# Patient Record
Sex: Male | Born: 1950
Health system: Southern US, Community
[De-identification: ages and names within clinical notes are randomized; demographics above are authoritative.]

## PROBLEM LIST (undated history)

## (undated) DIAGNOSIS — I209 Angina pectoris, unspecified: Secondary | ICD-10-CM

## (undated) DIAGNOSIS — I10 Essential (primary) hypertension: Secondary | ICD-10-CM

## (undated) HISTORY — DX: Angina pectoris, unspecified: I20.9

---

## 2002-03-19 ENCOUNTER — Encounter: Payer: Self-pay | Admitting: Emergency Medicine

## 2002-03-19 ENCOUNTER — Emergency Department (HOSPITAL_COMMUNITY): Admission: EM | Admit: 2002-03-19 | Discharge: 2002-03-19 | Payer: Self-pay | Admitting: Emergency Medicine

## 2008-09-07 ENCOUNTER — Encounter: Payer: Self-pay | Admitting: Cardiovascular Disease

## 2008-09-07 ENCOUNTER — Ambulatory Visit: Payer: Self-pay | Admitting: Cardiovascular Disease

## 2008-09-14 DIAGNOSIS — I10 Essential (primary) hypertension: Secondary | ICD-10-CM

## 2008-09-14 HISTORY — DX: Essential (primary) hypertension: I10

## 2011-12-04 ENCOUNTER — Emergency Department (HOSPITAL_COMMUNITY)
Admission: EM | Admit: 2011-12-04 | Discharge: 2011-12-04 | Disposition: A | Discharge status: Home / Self Care | Payer: Non-veteran care | Attending: Emergency Medicine | Admitting: Emergency Medicine

## 2011-12-04 ENCOUNTER — Encounter (HOSPITAL_COMMUNITY): Payer: Self-pay | Admitting: Emergency Medicine

## 2011-12-04 DIAGNOSIS — J029 Acute pharyngitis, unspecified: Secondary | ICD-10-CM

## 2011-12-04 DIAGNOSIS — I10 Essential (primary) hypertension: Secondary | ICD-10-CM | POA: Insufficient documentation

## 2011-12-04 DIAGNOSIS — F17200 Nicotine dependence, unspecified, uncomplicated: Secondary | ICD-10-CM | POA: Insufficient documentation

## 2011-12-04 HISTORY — DX: Essential (primary) hypertension: I10

## 2011-12-04 MED ORDER — HYDROCODONE-ACETAMINOPHEN 5-325 MG PO TABS: 1.0000 | ORAL_TABLET | Freq: Four times a day (QID) | ORAL | Status: AC | PRN

## 2011-12-04 MED ORDER — PENICILLIN V POTASSIUM 500 MG PO TABS: 500.0000 mg | ORAL_TABLET | Freq: Four times a day (QID) | ORAL | Status: AC

## 2011-12-04 NOTE — Discharge Instructions (Signed)
Follow up with your md or dr. Jenne Pane if not improving.

## 2011-12-04 NOTE — Progress Notes (Signed)
Stephen Walker, 61 year old African American male is in Trauma C, and his family is in consultation room C 26.  I received a referral to this family from Chaplain Self who ministered to them this afternoon.  More family members arrived this evening, and I joined with their loved ones.  Patient will be moving to room 2102 tonight.  I will follow up as needed.

## 2011-12-04 NOTE — ED Notes (Signed)
For 1.5 weeks, had swollen tonsils, difficulty swallowing, sore throat, radiating to ear; denies fevers;

## 2011-12-04 NOTE — ED Provider Notes (Signed)
History   This chart was scribed for Stephen Lennert, MD by Sofie Rower. The patient was seen in room TR10C/TR10C and the patient's care was started at 7:06 PM     CSN: [id number]  Arrival date & time 12/04/11  1719   None     Chief Complaint  Patient presents with  . Sore Throat    (Consider location/radiation/quality/duration/timing/severity/associated sxs/prior treatment) Patient is a 61 y.o. male presenting with pharyngitis. The history is provided by the patient. No language interpreter was used.  Sore Throat The current episode started more than 1 week ago. The problem occurs constantly. The problem has not changed since onset.Pertinent negatives include no chest pain, no abdominal pain, no headaches and no shortness of breath. Nothing aggravates the symptoms. Nothing relieves the symptoms. He has tried nothing for the symptoms. The treatment provided no relief.   Stephen Walker is a 61 y.o. male who presents to the Emergency Department complaining of moderate, episodic sore throat onset seven days ago with associated symptoms of difficulty swallowing, pain radiating to the ears.   PCP is the [facility].   Past Medical History  Diagnosis Date  . Hypertension       History  Substance Use Topics  . Smoking status: Current Everyday Smoker -- 0.5 packs/day  . Smokeless tobacco: Not on file  . Alcohol Use: No      Review of Systems  Constitutional: Negative for fatigue.  HENT: Negative for congestion, sinus pressure and ear discharge.   Eyes: Negative for discharge.  Respiratory: Negative for cough and shortness of breath.   Cardiovascular: Negative for chest pain.  Gastrointestinal: Negative for abdominal pain and diarrhea.  Genitourinary: Negative for frequency and hematuria.  Musculoskeletal: Negative for back pain.  Skin: Negative for rash.  Neurological: Negative for seizures and headaches.  Hematological: Negative.   Psychiatric/Behavioral: Negative for  hallucinations.    Allergies  Review of patient's allergies indicates no known allergies.  Home Medications   Current Outpatient Rx  Name Route Sig Dispense Refill  . LISINOPRIL-HYDROCHLOROTHIAZIDE 10-12.5 MG PO TABS Oral Take 1 tablet by mouth daily.    Marland Kitchen METOPROLOL SUCCINATE ER 100 MG PO TB24 Oral Take 50 mg by mouth daily. Take with or immediately following a meal.      BP 126/92  Pulse 70  Temp 98.5 F (36.9 C) (Oral)  Resp 19  SpO2 96%  Physical Exam  Nursing note and vitals reviewed. Constitutional: He is oriented to person, place, and time. He appears well-developed.  HENT:  Head: Normocephalic.  Mouth/Throat: Posterior oropharyngeal erythema (Minimal) present.  Eyes: Conjunctivae are normal.  Neck: No tracheal deviation present.       Bilateral anterior cervical swollen lymph nodes.   Cardiovascular: Normal rate and normal heart sounds.   No murmur heard. Pulmonary/Chest: Effort normal.  Musculoskeletal: Normal range of motion.  Neurological: He is oriented to person, place, and time.  Skin: Skin is warm.  Psychiatric: He has a normal mood and affect.    ED Course  Procedures (including critical care time)  DIAGNOSTIC STUDIES: Oxygen Saturation is 96% on room air, adequate by my interpretation.    COORDINATION OF CARE:  7:11PM- EDP at bedside discusses treatment plan.     Labs Reviewed - No data to display No results found.   No diagnosis found.    MDM       The chart was scribed for me under my direct supervision.  I personally performed the history, physical,  and medical decision making and all procedures in the evaluation of this patient.Stephen Lennert, MD 12/04/11 [phone]

## 2011-12-07 DIAGNOSIS — G931 Anoxic brain damage, not elsewhere classified: Secondary | ICD-10-CM

## 2011-12-07 DIAGNOSIS — G40109 Localization-related (focal) (partial) symptomatic epilepsy and epileptic syndromes with simple partial seizures, not intractable, without status epilepticus: Secondary | ICD-10-CM

## 2012-03-19 ENCOUNTER — Encounter: Payer: Self-pay | Admitting: Internal Medicine

## 2012-03-19 ENCOUNTER — Ambulatory Visit (INDEPENDENT_AMBULATORY_CARE_PROVIDER_SITE_OTHER): Payer: Non-veteran care | Admitting: Internal Medicine

## 2012-03-19 VITALS — BP 134/85 | HR 78 | Temp 97.9°F | Ht 73.0 in | Wt 186.4 lb

## 2012-03-19 DIAGNOSIS — Z636 Dependent relative needing care at home: Secondary | ICD-10-CM | POA: Insufficient documentation

## 2012-03-19 DIAGNOSIS — F4323 Adjustment disorder with mixed anxiety and depressed mood: Secondary | ICD-10-CM

## 2012-03-19 DIAGNOSIS — I10 Essential (primary) hypertension: Secondary | ICD-10-CM

## 2012-03-19 DIAGNOSIS — Z6379 Other stressful life events affecting family and household: Secondary | ICD-10-CM

## 2012-03-19 HISTORY — DX: Adjustment disorder with mixed anxiety and depressed mood: F43.23

## 2012-03-19 HISTORY — DX: Dependent relative needing care at home: Z63.6

## 2012-03-19 MED ORDER — ALPRAZOLAM 0.25 MG PO TABS: 0.2500 mg | ORAL_TABLET | Freq: Three times a day (TID) | ORAL | Status: AC | PRN

## 2012-03-19 NOTE — Progress Notes (Signed)
  Subjective:    Patient ID: Stephen Walker, male    DOB: 01-08-1951, 61 y.o.   MRN: [id number]  HPI  Pt presents acutely as new pt to clinic for stress and anxiety secondary to his mother's illness who is currently being treated on inpatient service.  He states that he "feels on edge", is only able to sleep 2 hours a night due to thoughts of his mother's impending death.  He endorses thoughts of suicide but denies a plan or homicidal ideation. He is a [place] vet who states that he did have PTSD treated with Valium in the 1970's but has no current issues.  He gets his primary care through [facility] in [place] and last saw PCP 3 months ago. 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  Review of Systems  Constitutional: Negative for fatigue.  HENT: Negative.   Eyes: Negative.   Respiratory: Negative.   Cardiovascular: Negative.   Musculoskeletal: Negative.   Neurological: Negative for dizziness, light-headedness and headaches.  Psychiatric/Behavioral: Positive for suicidal ideas, disturbed wake/sleep cycle, dysphoric mood, decreased concentration and agitation. Negative for confusion and self-injury. The patient is nervous/anxious.        Objective:   Physical Exam  Constitutional: He is oriented to person, place, and time. He appears well-developed and well-nourished. No distress.       Pleasant, conversant, AA male  HENT:  Head: Normocephalic and atraumatic.  Eyes: Conjunctivae normal and EOM are normal. Pupils are equal, round, and reactive to light.  Neurological: He is alert and oriented to person, place, and time.  Psychiatric: His speech is normal and behavior is normal. Judgment and thought content normal. His mood appears anxious. His affect is blunt. Cognition and memory are normal.          Assessment & Plan:  1. Situational Anxiety and Depression, acute :  Secondary to Caregiver Stress -xanax 0.25 mg q8h prn anxiety #30 no  refills given; consider increase to 0.50 mg if needed -counseling regarding caregiver stress, need to contact clinic or go to ED if anxiety worsens of fills suicidal or homicidal -declines chaplain

## 2012-03-19 NOTE — Assessment & Plan Note (Signed)
Secondary to Caregiver stress and impending death of mother. Mother currently inpatient on Hospice. No family support.

## 2012-03-19 NOTE — Patient Instructions (Signed)
If your mood worsens please call the clinic or go to the ED to be further evaluated. [facility].

## 2012-03-19 NOTE — Assessment & Plan Note (Signed)
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  Brother died years ago. No wife thus no family support. Declined chaplain comfort.

## 2018-05-11 ENCOUNTER — Ambulatory Visit (INDEPENDENT_AMBULATORY_CARE_PROVIDER_SITE_OTHER): Payer: Medicare Other | Admitting: Emergency Medicine

## 2018-05-11 ENCOUNTER — Encounter: Payer: Self-pay | Admitting: Emergency Medicine

## 2018-05-11 ENCOUNTER — Other Ambulatory Visit: Payer: Self-pay

## 2018-05-11 VITALS — BP 146/82 | HR 54 | Temp 99.2°F | Resp 16 | Ht 72.0 in | Wt 178.2 lb

## 2018-05-11 DIAGNOSIS — I10 Essential (primary) hypertension: Secondary | ICD-10-CM | POA: Diagnosis not present

## 2018-05-11 DIAGNOSIS — M25561 Pain in right knee: Secondary | ICD-10-CM

## 2018-05-11 DIAGNOSIS — G8929 Other chronic pain: Secondary | ICD-10-CM

## 2018-05-11 DIAGNOSIS — G4700 Insomnia, unspecified: Secondary | ICD-10-CM | POA: Insufficient documentation

## 2018-05-11 HISTORY — DX: Insomnia, unspecified: G47.00

## 2018-05-11 HISTORY — DX: Other chronic pain: G89.29

## 2018-05-11 MED ORDER — TRAZODONE HCL 50 MG PO TABS: 50.0000 mg | ORAL_TABLET | Freq: Every day | ORAL | 3 refills | Status: DC

## 2018-05-11 MED ORDER — HYDROCHLOROTHIAZIDE 25 MG PO TABS: 25.0000 mg | ORAL_TABLET | Freq: Every day | ORAL | 3 refills | Status: DC

## 2018-05-11 MED ORDER — METOPROLOL SUCCINATE 200 MG PO CS24: 1.0000 | EXTENDED_RELEASE_CAPSULE | Freq: Every day | ORAL | 3 refills | Status: DC

## 2018-05-11 NOTE — Progress Notes (Signed)
Stephen Walker 67 y.o.   Chief Complaint  Patient presents with  . New pt  . Blood Pressure  . Arthritis    right knee x 2 mos  . Medication Refill    Trazadine HCL 50 mg, HCTZ 25 mg, Metoprolol Succinate 200 mg    HISTORY OF PRESENT ILLNESS: This is a 67 y.o. male new patient with a history of hypertension needs medication refills.  Also complaining of chronic pain to his right knee.  No other significant symptoms.  No other complaints.  HPI   Prior to Admission medications   Medication Sig Start Date End Date Taking? Authorizing Provider  hydrochlorothiazide (HYDRODIURIL) 25 MG tablet Take 25 mg by mouth daily.   Yes [provider]  Metoprolol Succinate 200 MG CS24 Take by mouth.   Yes [provider]  traZODone (DESYREL) 50 MG tablet Take 50 mg by mouth at bedtime.   Yes [provider]    No Known Allergies  There are no active problems to display for this patient.   Past Medical History:  Diagnosis Date  . Hypertension       Social History   Socioeconomic History  . Marital status: Married    Spouse name: Not on file  . Number of children: Not on file  . Years of education: Not on file  . Highest education level: Not on file  Occupational History  . Not on file  Social Needs  . Financial resource strain: Not on file  . Food insecurity:    Worry: Not on file    Inability: Not on file  . Transportation needs:    Medical: Not on file    Non-medical: Not on file  Tobacco Use  . Smoking status: Current Every Day Smoker  . Smokeless tobacco: Never Used  Substance and Sexual Activity  . Alcohol use: Never    Frequency: Never  . Drug use: Never  . Sexual activity: Not on file  Lifestyle  . Physical activity:    Days per week: Not on file    Minutes per session: Not on file  . Stress: Not on file  Relationships  . Social connections:    Talks on phone: Not on file    Gets together: Not on file    Attends religious  service: Not on file    Active member of club or organization: Not on file    Attends meetings of clubs or organizations: Not on file    Relationship status: Not on file  . Intimate partner violence:    Fear of current or ex partner: Not on file    Emotionally abused: Not on file    Physically abused: Not on file    Forced sexual activity: Not on file  Other Topics Concern  . Not on file  Social History Narrative  . Not on file    Family History  Problem Relation Age of Onset  . Heart disease Mother        Atrial Fibrillation     Review of Systems  Constitutional: Negative.  Negative for chills and fever.  HENT: Negative.  Negative for congestion, hearing loss, nosebleeds and sore throat.   Eyes: Negative.  Negative for blurred vision and double vision.  Respiratory: Negative.  Negative for cough and shortness of breath.   Cardiovascular: Negative.  Negative for chest pain and palpitations.  Gastrointestinal: Negative.  Negative for abdominal pain, diarrhea, nausea and vomiting.  Genitourinary: Negative.   Musculoskeletal:  Negative.  Negative for back pain, myalgias and neck pain.  Skin: Negative.  Negative for rash.  Neurological: Negative.  Negative for dizziness and headaches.  Endo/Heme/Allergies: Negative.   All other systems reviewed and are negative.   Vitals:   05/11/18 1059 05/11/18 1109  BP: (!) 178/99 (!) 146/82  Pulse: (!) 54   Resp: 16   Temp: 99.2 F (37.3 C)   SpO2: 96%     Physical Exam  Constitutional: He is oriented to person, place, and time. He appears well-developed and well-nourished.  HENT:  Head: Normocephalic and atraumatic.  Nose: Nose normal.  Mouth/Throat: Oropharynx is clear and moist.  Eyes: Pupils are equal, round, and reactive to light. Conjunctivae and EOM are normal.  Neck: Normal range of motion. Neck supple.  Cardiovascular: Normal rate, regular rhythm and normal heart sounds.  Pulmonary/Chest: Effort normal and breath sounds  normal.  Musculoskeletal:  Right knee: No erythema or bruising.  Positive swelling.  Nontender.  Full range of motion. Left knee: Normal limits.  Neurological: He is alert and oriented to person, place, and time. No sensory deficit. He exhibits normal muscle tone.  Skin: Skin is warm and dry.  Psychiatric: He has a normal mood and affect. His behavior is normal.  Vitals reviewed.    ASSESSMENT & PLAN: Stephen Walker was seen today for new pt, blood pressure, arthritis and medication refill.  Diagnoses and all orders for this visit:  Uncontrolled hypertension  Essential hypertension -     hydrochlorothiazide (HYDRODIURIL) 25 MG tablet; Take 1 tablet (25 mg total) by mouth daily. -     Metoprolol Succinate 200 MG CS24; Take 1 tablet by mouth daily.  Insomnia, unspecified type -     traZODone (DESYREL) 50 MG tablet; Take 1 tablet (50 mg total) by mouth at bedtime.  Chronic pain of right knee    Patient Instructions       If you have lab work done today you will be contacted with your lab results within the next 2 weeks.  If you have not heard from [place] then please contact us. The fastest way to get your results is to register for My Chart.   IF you received an x-ray today, you will receive an invoice from [facility]. Please contact [facility] at [phone] with questions or concerns regarding your invoice.   IF you received labwork today, you will receive an invoice from [facility]. Please contact [facility] at [phone] with questions or concerns regarding your invoice.   Our billing staff will not be able to assist you with questions regarding bills from these companies.  You will be contacted with the lab results as soon as they are available. The fastest way to get your results is to activate your My Chart account. Instructions are located on the last page of this paperwork. If you have not heard from [place] regarding the results in 2 weeks, please contact this  office.     Hypertension Hypertension, commonly called high blood pressure, is when the force of blood pumping through the arteries is too strong. The arteries are the blood vessels that carry blood from the heart throughout the body. Hypertension forces the heart to work harder to pump blood and may cause arteries to become narrow or stiff. Having untreated or uncontrolled hypertension can cause heart attacks, strokes, kidney disease, and other problems. A blood pressure reading consists of a higher number over a lower number. Ideally, your blood pressure should be below 120/80. The first ("  top") number is called the systolic pressure. It is a measure of the pressure in your arteries as your heart beats. The second ("bottom") number is called the diastolic pressure. It is a measure of the pressure in your arteries as the heart relaxes. What are the causes? The cause of this condition is not known. What increases the risk? Some risk factors for high blood pressure are under your control. Others are not. Factors you can change  Smoking.  Having type 2 diabetes mellitus, high cholesterol, or both.  Not getting enough exercise or physical activity.  Being overweight.  Having too much fat, sugar, calories, or salt (sodium) in your diet.  Drinking too much alcohol. Factors that are difficult or impossible to change  Having chronic kidney disease.  Having a family history of high blood pressure.  Age. Risk increases with age.  Race. You may be at higher risk if you are African-American.  Gender. Men are at higher risk than women before age 65. After age 65, women are at higher risk than men.  Having obstructive sleep apnea.  Stress. What are the signs or symptoms? Extremely high blood pressure (hypertensive crisis) may cause:  Headache.  Anxiety.  Shortness of breath.  Nosebleed.  Nausea and vomiting.  Severe chest pain.  Jerky movements you cannot control  (seizures).  How is this diagnosed? This condition is diagnosed by measuring your blood pressure while you are seated, with your arm resting on a surface. The Walker of the blood pressure monitor will be placed directly against the skin of your upper arm at the level of your heart. It should be measured at least twice using the same arm. Certain conditions can cause a difference in blood pressure between your right and left arms. Certain factors can cause blood pressure readings to be lower or higher than normal (elevated) for a short period of time:  When your blood pressure is higher when you are in a health care provider's office than when you are at home, this is called white coat hypertension. Most people with this condition do not need medicines.  When your blood pressure is higher at home than when you are in a health care provider's office, this is called masked hypertension. Most people with this condition may need medicines to control blood pressure.  If you have a high blood pressure reading during one visit or you have normal blood pressure with other risk factors:  You may be asked to return on a different day to have your blood pressure checked again.  You may be asked to monitor your blood pressure at home for 1 week or longer.  If you are diagnosed with hypertension, you may have other blood or imaging tests to help your health care provider understand your overall risk for other conditions. How is this treated? This condition is treated by making healthy lifestyle changes, such as eating healthy foods, exercising more, and reducing your alcohol intake. Your health care provider may prescribe medicine if lifestyle changes are not enough to get your blood pressure under control, and if:  Your systolic blood pressure is above 130.  Your diastolic blood pressure is above 80.  Your personal target blood pressure may vary depending on your medical conditions, your age, and other  factors. Follow these instructions at home: Eating and drinking  Eat a diet that is high in fiber and potassium, and low in sodium, added sugar, and fat. An example eating plan is called the DASH (Dietary  Approaches to Stop Hypertension) diet. To eat this way: ? Eat plenty of fresh fruits and vegetables. Try to fill half of your plate at each meal with fruits and vegetables. ? Eat whole grains, such as whole wheat pasta, brown rice, or whole grain bread. Fill about one quarter of your plate with whole grains. ? Eat or drink low-fat dairy products, such as skim milk or low-fat yogurt. ? Avoid fatty cuts of meat, processed or cured meats, and poultry with skin. Fill about one quarter of your plate with lean proteins, such as fish, chicken without skin, beans, eggs, and tofu. ? Avoid premade and processed foods. These tend to be higher in sodium, added sugar, and fat.  Reduce your daily sodium intake. Most people with hypertension should eat less than 1,500 mg of sodium a day.  Limit alcohol intake to no more than 1 drink a day for nonpregnant women and 2 drinks a day for men. One drink equals 12 oz of beer, 5 oz of wine, or 1 oz of hard liquor. Lifestyle  Work with your health care provider to maintain a healthy body weight or to lose weight. Ask what an ideal weight is for you.  Get at least 30 minutes of exercise that causes your heart to beat faster (aerobic exercise) most days of the week. Activities may include walking, swimming, or biking.  Include exercise to strengthen your muscles (resistance exercise), such as pilates or lifting weights, as part of your weekly exercise routine. Try to do these types of exercises for 30 minutes at least 3 days a week.  Do not use any products that contain nicotine or tobacco, such as cigarettes and e-cigarettes. If you need help quitting, ask your health care provider.  Monitor your blood pressure at home as told by your health care provider.  Keep  all follow-up visits as told by your health care provider. This is important. Medicines  Take over-the-counter and prescription medicines only as told by your health care provider. Follow directions carefully. Blood pressure medicines must be taken as prescribed.  Do not skip doses of blood pressure medicine. Doing this puts you at risk for problems and can make the medicine less effective.  Ask your health care provider about side effects or reactions to medicines that you should watch for. Contact a health care provider if:  You think you are having a reaction to a medicine you are taking.  You have headaches that keep coming back (recurring).  You feel dizzy.  You have swelling in your ankles.  You have trouble with your vision. Get help right away if:  You develop a severe headache or confusion.  You have unusual weakness or numbness.  You feel faint.  You have severe pain in your chest or abdomen.  You vomit repeatedly.  You have trouble breathing. Summary  Hypertension is when the force of blood pumping through your arteries is too strong. If this condition is not controlled, it may put you at risk for serious complications.  Your personal target blood pressure may vary depending on your medical conditions, your age, and other factors. For most people, a normal blood pressure is less than 120/80.  Hypertension is treated with lifestyle changes, medicines, or a combination of both. Lifestyle changes include weight loss, eating a healthy, low-sodium diet, exercising more, and limiting alcohol. This information is not intended to replace advice given to you by your health care provider. Make sure you discuss any questions you have with  your health care provider. Document Released: 05/26/2005 Document Revised: 04/23/2016 Document Reviewed: 04/23/2016 Elsevier Interactive Patient Education  2018 [employer]      Edwina Barth, MD [facility]

## 2018-05-11 NOTE — Patient Instructions (Addendum)
   If you have lab work done today you will be contacted with your lab results within the next 2 weeks.  If you have not heard from us then please contact us. The fastest way to get your results is to register for My Chart.   IF you received an x-ray today, you will receive an invoice from [facility]. Please contact [facility] at [phone] with questions or concerns regarding your invoice.   IF you received labwork today, you will receive an invoice from [facility]. Please contact [facility] at [phone] with questions or concerns regarding your invoice.   Our billing staff will not be able to assist you with questions regarding bills from these companies.  You will be contacted with the lab results as soon as they are available. The fastest way to get your results is to activate your My Chart account. Instructions are located on the last page of this paperwork. If you have not heard from us regarding the results in 2 weeks, please contact this office.     Hypertension Hypertension, commonly called high blood pressure, is when the force of blood pumping through the arteries is too strong. The arteries are the blood vessels that carry blood from the heart throughout the body. Hypertension forces the heart to work harder to pump blood and may cause arteries to become narrow or stiff. Having untreated or uncontrolled hypertension can cause heart attacks, strokes, kidney disease, and other problems. A blood pressure reading consists of a higher number over a lower number. Ideally, your blood pressure should be below 120/80. The first ("top") number is called the systolic pressure. It is a measure of the pressure in your arteries as your heart beats. The second ("bottom") number is called the diastolic pressure. It is a measure of the pressure in your arteries as the heart relaxes. What are the causes? The cause of this condition is not known. What increases the risk? Some  risk factors for high blood pressure are under your control. Others are not. Factors you can change  Smoking.  Having type 2 diabetes mellitus, high cholesterol, or both.  Not getting enough exercise or physical activity.  Being overweight.  Having too much fat, sugar, calories, or salt (sodium) in your diet.  Drinking too much alcohol. Factors that are difficult or impossible to change  Having chronic kidney disease.  Having a family history of high blood pressure.  Age. Risk increases with age.  Race. You may be at higher risk if you are African-American.  Gender. Men are at higher risk than women before age 45. After age 65, women are at higher risk than men.  Having obstructive sleep apnea.  Stress. What are the signs or symptoms? Extremely high blood pressure (hypertensive crisis) may cause:  Headache.  Anxiety.  Shortness of breath.  Nosebleed.  Nausea and vomiting.  Severe chest pain.  Jerky movements you cannot control (seizures).  How is this diagnosed? This condition is diagnosed by measuring your blood pressure while you are seated, with your arm resting on a surface. The cuff of the blood pressure monitor will be placed directly against the skin of your upper arm at the level of your heart. It should be measured at least twice using the same arm. Certain conditions can cause a difference in blood pressure between your right and left arms. Certain factors can cause blood pressure readings to be lower or higher than normal (elevated) for a short period of time:  When   your blood pressure is higher when you are in a health care provider's office than when you are at home, this is called white coat hypertension. Most people with this condition do not need medicines.  When your blood pressure is higher at home than when you are in a health care provider's office, this is called masked hypertension. Most people with this condition may need medicines to control  blood pressure.  If you have a high blood pressure reading during one visit or you have normal blood pressure with other risk factors:  You may be asked to return on a different day to have your blood pressure checked again.  You may be asked to monitor your blood pressure at home for 1 week or longer.  If you are diagnosed with hypertension, you may have other blood or imaging tests to help your health care provider understand your overall risk for other conditions. How is this treated? This condition is treated by making healthy lifestyle changes, such as eating healthy foods, exercising more, and reducing your alcohol intake. Your health care provider may prescribe medicine if lifestyle changes are not enough to get your blood pressure under control, and if:  Your systolic blood pressure is above 130.  Your diastolic blood pressure is above 80.  Your personal target blood pressure may vary depending on your medical conditions, your age, and other factors. Follow these instructions at home: Eating and drinking  Eat a diet that is high in fiber and potassium, and low in sodium, added sugar, and fat. An example eating plan is called the DASH (Dietary Approaches to Stop Hypertension) diet. To eat this way: ? Eat plenty of fresh fruits and vegetables. Try to fill half of your plate at each meal with fruits and vegetables. ? Eat whole grains, such as whole wheat pasta, brown rice, or whole grain bread. Fill about one quarter of your plate with whole grains. ? Eat or drink low-fat dairy products, such as skim milk or low-fat yogurt. ? Avoid fatty cuts of meat, processed or cured meats, and poultry with skin. Fill about one quarter of your plate with lean proteins, such as fish, chicken without skin, beans, eggs, and tofu. ? Avoid premade and processed foods. These tend to be higher in sodium, added sugar, and fat.  Reduce your daily sodium intake. Most people with hypertension should eat less  than 1,500 mg of sodium a day.  Limit alcohol intake to no more than 1 drink a day for nonpregnant women and 2 drinks a day for men. One drink equals 12 oz of beer, 5 oz of wine, or 1 oz of hard liquor. Lifestyle  Work with your health care provider to maintain a healthy body weight or to lose weight. Ask what an ideal weight is for you.  Get at least 30 minutes of exercise that causes your heart to beat faster (aerobic exercise) most days of the week. Activities may include walking, swimming, or biking.  Include exercise to strengthen your muscles (resistance exercise), such as pilates or lifting weights, as part of your weekly exercise routine. Try to do these types of exercises for 30 minutes at least 3 days a week.  Do not use any products that contain nicotine or tobacco, such as cigarettes and e-cigarettes. If you need help quitting, ask your health care provider.  Monitor your blood pressure at home as told by your health care provider.  Keep all follow-up visits as told by your health care provider.   This is important. Medicines  Take over-the-counter and prescription medicines only as told by your health care provider. Follow directions carefully. Blood pressure medicines must be taken as prescribed.  Do not skip doses of blood pressure medicine. Doing this puts you at risk for problems and can make the medicine less effective.  Ask your health care provider about side effects or reactions to medicines that you should watch for. Contact a health care provider if:  You think you are having a reaction to a medicine you are taking.  You have headaches that keep coming back (recurring).  You feel dizzy.  You have swelling in your ankles.  You have trouble with your vision. Get help right away if:  You develop a severe headache or confusion.  You have unusual weakness or numbness.  You feel faint.  You have severe pain in your chest or abdomen.  You vomit  repeatedly.  You have trouble breathing. Summary  Hypertension is when the force of blood pumping through your arteries is too strong. If this condition is not controlled, it may put you at risk for serious complications.  Your personal target blood pressure may vary depending on your medical conditions, your age, and other factors. For most people, a normal blood pressure is less than 120/80.  Hypertension is treated with lifestyle changes, medicines, or a combination of both. Lifestyle changes include weight loss, eating a healthy, low-sodium diet, exercising more, and limiting alcohol. This information is not intended to replace advice given to you by your health care provider. Make sure you discuss any questions you have with your health care provider. Document Released: 05/26/2005 Document Revised: 04/23/2016 Document Reviewed: 04/23/2016 Elsevier Interactive Patient Education  2018 [employer]  

## 2018-05-12 MED ORDER — METOPROLOL TARTRATE 100 MG PO TABS: 100.0000 mg | ORAL_TABLET | Freq: Two times a day (BID) | ORAL | 3 refills | Status: DC

## 2018-05-12 NOTE — Addendum Note (Signed)
Addended by: Evie LacksSAGARDIA, Lavette Yankovich J on: 05/12/2018 09:39 AM   Modules accepted: Orders

## 2018-05-13 ENCOUNTER — Encounter: Payer: Self-pay | Admitting: Internal Medicine

## 2018-06-18 ENCOUNTER — Other Ambulatory Visit: Payer: Self-pay | Admitting: Emergency Medicine

## 2018-06-18 MED ORDER — METOPROLOL SUCCINATE ER 200 MG PO TB24: 200.0000 mg | ORAL_TABLET | Freq: Every day | ORAL | 3 refills | Status: DC

## 2018-07-21 ENCOUNTER — Telehealth: Payer: Self-pay | Admitting: *Deleted

## 2018-07-21 NOTE — Telephone Encounter (Signed)
Pt called back to speak with Raynelle Fanning. Unavailable. Please advise.

## 2018-07-21 NOTE — Telephone Encounter (Signed)
Left detailed message to schedule  AWV 

## 2018-08-12 ENCOUNTER — Ambulatory Visit: Payer: Self-pay

## 2018-08-12 ENCOUNTER — Ambulatory Visit: Payer: Medicare Other | Admitting: Emergency Medicine

## 2018-08-24 ENCOUNTER — Telehealth: Payer: Self-pay | Admitting: *Deleted

## 2018-08-24 ENCOUNTER — Ambulatory Visit: Payer: Self-pay

## 2018-08-24 NOTE — Telephone Encounter (Signed)
Faxed Rx request from [facility] for Hctz 25 mg tablet #90, Metoprolol Tart 100 mg tablet #180 and Trazodone 50 mg #90, all prescriptions with 3 refills. Confirmation page received 5:25 pm.

## 2018-08-30 ENCOUNTER — Ambulatory Visit: Payer: Self-pay

## 2018-09-07 ENCOUNTER — Other Ambulatory Visit: Payer: Self-pay

## 2018-09-07 DIAGNOSIS — I10 Essential (primary) hypertension: Secondary | ICD-10-CM

## 2018-09-13 ENCOUNTER — Ambulatory Visit: Payer: Medicare Other | Admitting: Emergency Medicine

## 2018-09-15 ENCOUNTER — Ambulatory Visit (INDEPENDENT_AMBULATORY_CARE_PROVIDER_SITE_OTHER): Payer: Medicare Other | Admitting: Registered Nurse

## 2018-09-15 ENCOUNTER — Encounter: Payer: Self-pay | Admitting: Registered Nurse

## 2018-09-15 ENCOUNTER — Other Ambulatory Visit: Payer: Self-pay

## 2018-09-15 VITALS — BP 146/82 | Ht 72.0 in | Wt 178.0 lb

## 2018-09-15 DIAGNOSIS — Z0000 Encounter for general adult medical examination without abnormal findings: Secondary | ICD-10-CM

## 2018-09-15 NOTE — Progress Notes (Signed)
Presents today for [employer] [facility]ness Visit This visit was conducted via telephone. The patient understands that this is in place of an in-person visit.  Date of last exam: 05/11/18: HTN Management, Dr. Edwina Barth  Interpreter used for this visit? No  Patient Care Team: Georgina Quint, MD as PCP - General (Internal Medicine) Default, Provider, MD   Other items to address today: None   Other Screening: Last screening for diabetes: None on file. Last lipid screening: None on file.  ADVANCE DIRECTIVES: Discussed: Yes On File: No Materials Provided: Yes - will be mailed to patient  Immunization status:   There is no immunization history on file for this patient. Pt states he believes he is up to date, however, no documentation on file for this patient.   Health Maintenance Due  Topic Date Due  . Hepatitis C Screening  16-Jun-1950  . HIV Screening  04/12/1968  . TETANUS/TDAP  04/12/1972  . COLONOSCOPY  04/13/2003  . PNA vac Low Risk Adult (1 of 2 - PCV13) 04/12/2018     Functional Status Survey: Is the patient deaf or have difficulty hearing?: No Does the patient have difficulty seeing, even when wearing glasses/contacts?: No Does the patient have difficulty concentrating, remembering, or making decisions?: No Does the patient have difficulty walking or climbing stairs?: No Does the patient have difficulty dressing or bathing?: No Does the patient have difficulty doing errands alone such as visiting a doctor's office or shopping?: No Clinical Intake - 09/15/18 1327      Nutrition Screen   Diabetes  No      Functional Status   Activities of Daily Living  Independent    Feeding  Independent    Dressing/Grooming  Independent    Bathing  Independent    Toileting  Independent    Transfer  Independent    Ambulation  Independent    Medication Administration  Independent    Home Management  Independent      Risk/Barriers   Barriers to Care  Management & Learning  None      Abuse/Neglect   Do you feel unsafe in your current relationship?  No    Do you feel physically threatened by others?  No    Anyone hurting you at home, work, or school?  No    Unable to ask?  No    Information provided on Community resources  No      Patient Literacy   How often do you need to have someone help you when you read instructions, pamphlets, or other written materials from your doctor or pharmacy?  1 - Never      Web designer Needed?  No       6CIT Screen 09/15/2018  What Year? 0 points  What month? 0 points  What time? 0 points  Count back from 20 0 points  Months in reverse 2 points  Repeat phrase 0 points  Total Score 2         Home Environment: Pt lives with spouse. Describes home as a safe and comfortable place. Does not feel in danger at home. No throw rugs. No grab bars in shower.    Patient Active Problem List   Diagnosis Date Noted  . Essential hypertension 05/11/2018  . Insomnia 05/11/2018  . Chronic pain of right knee 05/11/2018  . Caregiver stress 03/19/2012  . Situational mixed anxiety and depressive disorder 03/19/2012  . HYPERTENSION, BENIGN 09/14/2008  Past Medical History:  Diagnosis Date  . Hypertension      History reviewed. No pertinent surgical history.   Family History  Problem Relation Age of Onset  . Heart disease Mother        Atrial Fibrillation     Social History   Socioeconomic History  . Marital status: Married    Spouse name: Not on file  . Number of children: Not on file  . Years of education: Not on file  . Highest education level: Not on file  Occupational History  . Not on file  Social Needs  . Financial resource strain: Not on file  . Food insecurity:    Worry: Not on file    Inability: Not on file  . Transportation needs:    Medical: Not on file    Non-medical: Not on file  Tobacco Use  . Smoking status: Current Every Day Smoker     Packs/day: 0.50    Years: 50.00    Pack years: 25.00    Types: Cigarettes  . Smokeless tobacco: Never Used  Substance and Sexual Activity  . Alcohol use: Never    Frequency: Never  . Drug use: Never  . Sexual activity: Not Currently    Partners: Female  Lifestyle  . Physical activity:    Days per week: Not on file    Minutes per session: Not on file  . Stress: Not on file  Relationships  . Social connections:    Talks on phone: Not on file    Gets together: Not on file    Attends religious service: Not on file    Active member of club or organization: Not on file    Attends meetings of clubs or organizations: Not on file    Relationship status: Not on file  . Intimate partner violence:    Fear of current or ex partner: Not on file    Emotionally abused: Not on file    Physically abused: Not on file    Forced sexual activity: Not on file  Other Topics Concern  . Not on file  Social History Narrative   ** Merged History Encounter **         No Known Allergies   Prior to Admission medications   Medication Sig Start Date End Date Taking? Authorizing Provider  metoprolol tartrate (LOPRESSOR) 100 MG tablet Take 1 tablet (100 mg total) by mouth 2 (two) times daily. 05/12/18  Yes Sagardia, Eilleen KempfMiguel Jose, MD  hydrochlorothiazide (HYDRODIURIL) 25 MG tablet Take 25 mg by mouth daily. 08/24/18   [provider]  hydrochlorothiazide (HYDRODIURIL) 25 MG tablet Take 1 tablet (25 mg total) by mouth daily. 05/11/18 08/09/18  Georgina QuintSagardia, Miguel Jose, MD  lisinopril-hydrochlorothiazide (PRINZIDE,ZESTORETIC) 10-12.5 MG per tablet Take 1 tablet by mouth daily.    [provider]  metoprolol (TOPROL-XL) 200 MG 24 hr tablet Take 1 tablet (200 mg total) by mouth daily. Patient not taking: Reported on 09/15/2018 06/18/18 09/16/18  Georgina QuintSagardia, Miguel Jose, MD  traZODone (DESYREL) 50 MG tablet Take 50 mg by mouth daily. 08/24/18   [provider]  traZODone (DESYREL) 50 MG tablet Take  1 tablet (50 mg total) by mouth at bedtime. 05/11/18 08/09/18  Georgina QuintSagardia, Miguel Jose, MD     Depression screen [facility] 2/9 09/15/2018 05/11/2018 03/19/2012  Decreased Interest 0 0 0  Down, Depressed, Hopeless 0 0 0  PHQ - 2 Score 0 0 0     Fall Risk  09/15/2018 05/11/2018  Falls  in the past year? 0 0  Number falls in past yr: 0 -  Injury with Fall? 0 -      PHYSICAL EXAM: BP (!) 146/82   Ht 6' (1.829 m)   Wt 178 lb (80.7 kg)   BMI 24.14 kg/m    Wt Readings from Last 3 Encounters:  09/15/18 178 lb (80.7 kg)  05/11/18 178 lb 3.2 oz (80.8 kg)  03/19/12 186 lb 6.4 oz (84.6 kg)   The patient understands that no vitals could be obtained during this telephone visit, and, as such, vital signs were pulled forward from the patient's last visit.    Education/Counseling provided regarding diet and exercise, prevention of chronic diseases, smoking/tobacco cessation, if applicable, and reviewed "Covered Medicare Preventive Services."   ASSESSMENT/PLAN: There are no diagnoses linked to this encounter.

## 2018-09-15 NOTE — Patient Instructions (Addendum)
  Mr. Missel , Thank you for taking time to come for your Medicare Wellness Visit. I appreciate your ongoing commitment to your health goals. Please review the following plan we discussed and let me know if I can assist you in the future.   These are the goals we discussed: Goals    . Blood Pressure < 140/90       This is a list of the screening recommended for you and due dates:  Health Maintenance  Topic Date Due  .  Hepatitis C: One time screening is recommended by Center for Disease Control  (CDC) for  adults born from [birth year] through 1963.   12-18-1950  . HIV Screening  04/12/1968  . Tetanus Vaccine  04/12/1972  . Colon Cancer Screening  04/13/2003  . Pneumonia vaccines (1 of 2 - PCV13) 04/12/2018  . Flu Shot  01/08/2019

## 2018-09-16 NOTE — Addendum Note (Signed)
Addended by: Laurey Arrow on: 09/16/2018 10:55 AM   Modules accepted: Level of Service

## 2018-11-10 NOTE — Progress Notes (Signed)
I was was available for the history and physical exam. I personally discussed this patient at the time of the office visit and agree with the assessment and plan.   

## 2019-06-04 ENCOUNTER — Other Ambulatory Visit: Payer: Self-pay | Admitting: Emergency Medicine

## 2019-06-05 NOTE — Telephone Encounter (Signed)
Forwarding medication refill requests to the clinical pool for review. 

## 2019-06-06 NOTE — Telephone Encounter (Signed)
Called pt put him on schedule for med refills virtual visit

## 2019-06-06 NOTE — Telephone Encounter (Signed)
Patient need an appt before any refills

## 2019-06-07 ENCOUNTER — Telehealth (INDEPENDENT_AMBULATORY_CARE_PROVIDER_SITE_OTHER): Payer: Medicare Other | Admitting: Emergency Medicine

## 2019-06-07 ENCOUNTER — Other Ambulatory Visit: Payer: Self-pay

## 2019-06-07 ENCOUNTER — Encounter: Payer: Self-pay | Admitting: Emergency Medicine

## 2019-06-07 VITALS — Ht 72.0 in | Wt 197.0 lb

## 2019-06-07 DIAGNOSIS — I10 Essential (primary) hypertension: Secondary | ICD-10-CM | POA: Diagnosis not present

## 2019-06-07 DIAGNOSIS — G4700 Insomnia, unspecified: Secondary | ICD-10-CM

## 2019-06-07 MED ORDER — HYDROCHLOROTHIAZIDE 25 MG PO TABS: 25.0000 mg | ORAL_TABLET | Freq: Every day | ORAL | 3 refills | Status: DC

## 2019-06-07 MED ORDER — HYDROXYZINE HCL 25 MG PO TABS: 50.0000 mg | ORAL_TABLET | Freq: Every evening | ORAL | 1 refills | Status: DC | PRN

## 2019-06-07 MED ORDER — METOPROLOL TARTRATE 100 MG PO TABS: 100.0000 mg | ORAL_TABLET | Freq: Two times a day (BID) | ORAL | 3 refills | Status: DC

## 2019-06-07 NOTE — Patient Instructions (Signed)
° ° ° °  If you have lab work done today you will be contacted with your lab results within the next 2 weeks.  If you have not heard from us then please contact us. The fastest way to get your results is to register for My Chart. ° ° °IF you received an x-ray today, you will receive an invoice from [facility]. Please contact  Radiology at [phone] with questions or concerns regarding your invoice.  ° °IF you received labwork today, you will receive an invoice from [facility]. Please contact [facility] at [phone] with questions or concerns regarding your invoice.  ° °Our billing staff will not be able to assist you with questions regarding bills from these companies. ° °You will be contacted with the lab results as soon as they are available. The fastest way to get your results is to activate your My Chart account. Instructions are located on the last page of this paperwork. If you have not heard from us regarding the results in 2 weeks, please contact this office. °  ° ° ° °

## 2019-06-07 NOTE — Progress Notes (Signed)
Med refills. Talk Trazodone, to help him sleep.

## 2019-06-07 NOTE — Progress Notes (Signed)
Telemedicine Encounter- SOAP NOTE Established Patient  This telephone encounter was conducted with the patient's (or proxy's) verbal consent via audio telecommunications: yes/no: Yes Patient was instructed to have this encounter in a suitably private space; and to only have persons present to whom they give permission to participate. In addition, patient identity was confirmed by use of name plus two identifiers (DOB and address).  I discussed the limitations, risks, security and privacy concerns of performing an evaluation and management service by telephone and the availability of in person appointments. I also discussed with the patient that there may be a patient responsible charge related to this service. The patient expressed understanding and agreed to proceed.  I spent a total of TIME; 0 MIN TO 60 MIN: 15 minutes talking with the patient or their proxy.  No chief complaint on file.   Subjective   Stephen Walker is a 68 y.o. male established patient. Telephone visit today for hypertension follow-up and medication refill. Presently taking hydrochlorothiazide 25 mg daily and Lopressor 100 mg twice a day.  States blood pressures readings at home have been normal.  Also complaining of chronic insomnia.  Has been taking trazodone 50 mg for at least a year but not working like before anymore.  Requesting no medication.  No other complaints or medical concerns today.  HPI   Patient Active Problem List   Diagnosis Date Noted  . Essential hypertension 05/11/2018  . Insomnia 05/11/2018  . Chronic pain of right knee 05/11/2018  . Caregiver stress 03/19/2012  . Situational mixed anxiety and depressive disorder 03/19/2012  . HYPERTENSION, BENIGN 09/14/2008    Past Medical History:  Diagnosis Date  . Hypertension     Current Outpatient Medications  Medication Sig Dispense Refill  . hydrochlorothiazide (HYDRODIURIL) 25 MG tablet Take 25 mg by mouth daily.    . metoprolol tartrate  (LOPRESSOR) 100 MG tablet Take 1 tablet (100 mg total) by mouth 2 (two) times daily. 180 tablet 3  . traZODone (DESYREL) 50 MG tablet Take 50 mg by mouth daily.     No current facility-administered medications for this visit.    Allergies  Allergen Reactions  . Lisinopril Swelling    Lips swelling    Social History   Socioeconomic History  . Marital status: Married    Spouse name: Not on file  . Number of children: Not on file  . Years of education: Not on file  . Highest education level: Not on file  Occupational History  . Not on file  Tobacco Use  . Smoking status: Current Every Day Smoker    Packs/day: 0.50    Years: 50.00    Pack years: 25.00    Types: Cigarettes  . Smokeless tobacco: Never Used  Substance and Sexual Activity  . Alcohol use: Never  . Drug use: Never  . Sexual activity: Not Currently    Partners: Female  Other Topics Concern  . Not on file  Social History Narrative   ** Merged History Encounter **       Social Determinants of Health   Financial Resource Strain:   . Difficulty of Paying Living Expenses: Not on file  Food Insecurity:   . Worried About Programme researcher, broadcasting/film/video in the Last Year: Not on file  . Ran Out of Food in the Last Year: Not on file  Transportation Needs:   . Lack of Transportation (Medical): Not on file  . Lack of Transportation (Non-Medical): Not on file  Physical Activity:   . Days of Exercise per Week: Not on file  . Minutes of Exercise per Session: Not on file  Stress:   . Feeling of Stress : Not on file  Social Connections:   . Frequency of Communication with Friends and Family: Not on file  . Frequency of Social Gatherings with Friends and Family: Not on file  . Attends Religious Services: Not on file  . Active Member of Clubs or Organizations: Not on file  . Attends Archivist Meetings: Not on file  . Marital Status: Not on file  Intimate Partner Violence:   . Fear of Current or Ex-Partner: Not on file   . Emotionally Abused: Not on file  . Physically Abused: Not on file  . Sexually Abused: Not on file    Review of Systems  Constitutional: Negative.  Negative for chills and fever.  HENT: Negative.  Negative for congestion and sore throat.   Respiratory: Negative.  Negative for cough and shortness of breath.   Cardiovascular: Negative.  Negative for chest pain and palpitations.  Gastrointestinal: Negative.  Negative for abdominal pain, diarrhea, nausea and vomiting.  Genitourinary: Negative.  Negative for dysuria and hematuria.  Musculoskeletal: Negative.  Negative for myalgias and neck pain.  Skin: Negative.  Negative for rash.  Neurological: Negative.  Negative for dizziness and headaches.  Psychiatric/Behavioral: The patient has insomnia.   All other systems reviewed and are negative.   Objective  Alert and oriented x3 in no apparent respiratory distress. Vitals as reported by the patient: Today's Vitals   06/07/19 1310  Weight: 197 lb (89.4 kg)  Height: 6' (1.829 m)    There are no diagnoses linked to this encounter. Diagnoses and all orders for this visit:  Essential hypertension -     hydrochlorothiazide (HYDRODIURIL) 25 MG tablet; Take 1 tablet (25 mg total) by mouth daily. -     metoprolol tartrate (LOPRESSOR) 100 MG tablet; Take 1 tablet (100 mg total) by mouth 2 (two) times daily.  Insomnia, unspecified type -     hydrOXYzine (ATARAX/VISTARIL) 25 MG tablet; Take 2 tablets (50 mg total) by mouth at bedtime as needed.    Clinically stable.  No medical concerns identified during this visit.  Continue present medication.  No changes.  Office visit in 3 to 6 months I discussed the assessment and treatment plan with the patient. The patient was provided an opportunity to ask questions and all were answered. The patient agreed with the plan and demonstrated an understanding of the instructions.   The patient was advised to call back or seek an in-person evaluation if  the symptoms worsen or if the condition fails to improve as anticipated.  I provided 15 minutes of non-face-to-face time during this encounter.  Horald Pollen, MD  Primary Care at [facility]

## 2019-06-20 ENCOUNTER — Other Ambulatory Visit: Payer: Self-pay | Admitting: Emergency Medicine

## 2019-06-20 DIAGNOSIS — I10 Essential (primary) hypertension: Secondary | ICD-10-CM

## 2019-10-10 ENCOUNTER — Ambulatory Visit: Payer: Medicare Other

## 2019-10-10 ENCOUNTER — Other Ambulatory Visit: Payer: Self-pay

## 2019-10-10 NOTE — Patient Instructions (Addendum)
Thank you for taking time to come for your Medicare Wellness Visit. I appreciate your ongoing commitment to your health goals. Please review the following plan we discussed and let me know if I can assist you in the future.  Stephen Kennedy LPN  Preventive Care 67 Years and Older, Male Preventive care refers to lifestyle choices and visits with your health care provider that can promote health and wellness. This includes:  A yearly physical exam. This is also called an annual well check.  Regular dental and eye exams.  Immunizations.  Screening for certain conditions.  Healthy lifestyle choices, such as diet and exercise. What can I expect for my preventive care visit? Physical exam Your health care provider will check:  Height and weight. These may be used to calculate body mass index (BMI), which is a measurement that tells if you are at a healthy weight.  Heart rate and blood pressure.  Your skin for abnormal spots. Counseling Your health care provider may ask you questions about:  Alcohol, tobacco, and drug use.  Emotional well-being.  Home and relationship well-being.  Sexual activity.  Eating habits.  History of falls.  Memory and ability to understand (cognition).  Work and work Statistician. What immunizations do I need?  Influenza (flu) vaccine  This is recommended every year. Tetanus, diphtheria, and pertussis (Tdap) vaccine  You may need a Td booster every 10 years. Varicella (chickenpox) vaccine  You may need this vaccine if you have not already been vaccinated. Zoster (shingles) vaccine  You may need this after age 66. Pneumococcal conjugate (PCV13) vaccine  One dose is recommended after age 84. Pneumococcal polysaccharide (PPSV23) vaccine  One dose is recommended after age 88. Measles, mumps, and rubella (MMR) vaccine  You may need at least one dose of MMR if you were born in 1957 or later. You may also need a second dose. Meningococcal  conjugate (MenACWY) vaccine  You may need this if you have certain conditions. Hepatitis A vaccine  You may need this if you have certain conditions or if you travel or work in places where you may be exposed to hepatitis A. Hepatitis B vaccine  You may need this if you have certain conditions or if you travel or work in places where you may be exposed to hepatitis B. Haemophilus influenzae type b (Hib) vaccine  You may need this if you have certain conditions. You may receive vaccines as individual doses or as more than one vaccine together in one shot (combination vaccines). Talk with your health care provider about the risks and benefits of combination vaccines. What tests do I need? Blood tests  Lipid and cholesterol levels. These may be checked every 5 years, or more frequently depending on your overall health.  Hepatitis C test.  Hepatitis B test. Screening  Lung cancer screening. You may have this screening every year starting at age 24 if you have a 30-pack-year history of smoking and currently smoke or have quit within the past 15 years.  Colorectal cancer screening. All adults should have this screening starting at age 52 and continuing until age 61. Your health care provider may recommend screening at age 57 if you are at increased risk. You will have tests every 1-10 years, depending on your results and the type of screening test.  Prostate cancer screening. Recommendations will vary depending on your family history and other risks.  Diabetes screening. This is done by checking your blood sugar (glucose) after you have not eaten for  a while (fasting). You may have this done every 1-3 years.  Abdominal aortic aneurysm (AAA) screening. You may need this if you are a current or former smoker.  Sexually transmitted disease (STD) testing. Follow these instructions at home: Eating and drinking  Eat a diet that includes fresh fruits and vegetables, whole grains, lean  protein, and low-fat dairy products. Limit your intake of foods with high amounts of sugar, saturated fats, and salt.  Take vitamin and mineral supplements as recommended by your health care provider.  Do not drink alcohol if your health care provider tells you not to drink.  If you drink alcohol: ? Limit how much you have to 0-2 drinks a day. ? Be aware of how much alcohol is in your drink. In the [place] one drink equals one 12 oz bottle of beer (355 mL), one 5 oz glass of wine (148 mL), or one 1 oz glass of hard liquor (44 mL). Lifestyle  Take daily care of your teeth and gums.  Stay active. Exercise for at least 30 minutes on 5 or more days each week.  Do not use any products that contain nicotine or tobacco, such as cigarettes, e-cigarettes, and chewing tobacco. If you need help quitting, ask your health care provider.  If you are sexually active, practice safe sex. Use a condom or other form of protection to prevent STIs (sexually transmitted infections).  Talk with your health care provider about taking a low-dose aspirin or statin. What's next?  Visit your health care provider once a year for a well check visit.  Ask your health care provider how often you should have your eyes and teeth checked.  Stay up to date on all vaccines. This information is not intended to replace advice given to you by your health care provider. Make sure you discuss any questions you have with your health care provider. Document Revised: 05/20/2018 Document Reviewed: 05/20/2018 Elsevier Patient Education  2020 Elsevier Inc[employer]  

## 2019-10-27 DIAGNOSIS — I10 Essential (primary) hypertension: Secondary | ICD-10-CM | POA: Diagnosis not present

## 2019-10-27 DIAGNOSIS — Z79899 Other long term (current) drug therapy: Secondary | ICD-10-CM | POA: Diagnosis not present

## 2019-10-27 DIAGNOSIS — Z23 Encounter for immunization: Secondary | ICD-10-CM | POA: Diagnosis not present

## 2019-10-27 DIAGNOSIS — I209 Angina pectoris, unspecified: Secondary | ICD-10-CM | POA: Diagnosis not present

## 2019-11-09 ENCOUNTER — Other Ambulatory Visit: Payer: Self-pay | Admitting: General Practice

## 2019-11-09 ENCOUNTER — Ambulatory Visit
Admission: RE | Admit: 2019-11-09 | Discharge: 2019-11-09 | Disposition: A | Discharge status: Home / Self Care | Payer: Medicare Other | Source: Ambulatory Visit | Attending: General Practice | Admitting: General Practice

## 2019-11-09 DIAGNOSIS — M25461 Effusion, right knee: Secondary | ICD-10-CM | POA: Diagnosis not present

## 2019-11-09 DIAGNOSIS — M1711 Unilateral primary osteoarthritis, right knee: Secondary | ICD-10-CM | POA: Diagnosis not present

## 2019-11-09 DIAGNOSIS — M25562 Pain in left knee: Secondary | ICD-10-CM | POA: Diagnosis not present

## 2019-11-09 DIAGNOSIS — M25561 Pain in right knee: Secondary | ICD-10-CM

## 2019-11-23 DIAGNOSIS — I10 Essential (primary) hypertension: Secondary | ICD-10-CM | POA: Diagnosis not present

## 2019-11-23 DIAGNOSIS — Z79899 Other long term (current) drug therapy: Secondary | ICD-10-CM | POA: Diagnosis not present

## 2019-11-23 DIAGNOSIS — Z0001 Encounter for general adult medical examination with abnormal findings: Secondary | ICD-10-CM | POA: Diagnosis not present

## 2019-11-23 DIAGNOSIS — I209 Angina pectoris, unspecified: Secondary | ICD-10-CM | POA: Diagnosis not present

## 2019-11-24 ENCOUNTER — Telehealth: Payer: Self-pay | Admitting: *Deleted

## 2019-11-24 DIAGNOSIS — Z1211 Encounter for screening for malignant neoplasm of colon: Secondary | ICD-10-CM | POA: Diagnosis not present

## 2019-11-24 NOTE — Telephone Encounter (Signed)
Schedule AWV.  

## 2019-12-14 ENCOUNTER — Encounter: Payer: Self-pay | Admitting: Podiatry

## 2019-12-14 ENCOUNTER — Ambulatory Visit (INDEPENDENT_AMBULATORY_CARE_PROVIDER_SITE_OTHER): Payer: Medicare Other | Admitting: Podiatry

## 2019-12-14 ENCOUNTER — Other Ambulatory Visit: Payer: Self-pay

## 2019-12-14 VITALS — Temp 96.5°F

## 2019-12-14 DIAGNOSIS — M79672 Pain in left foot: Secondary | ICD-10-CM

## 2019-12-14 DIAGNOSIS — B351 Tinea unguium: Secondary | ICD-10-CM

## 2019-12-14 DIAGNOSIS — L84 Corns and callosities: Secondary | ICD-10-CM | POA: Diagnosis not present

## 2019-12-14 DIAGNOSIS — M79671 Pain in right foot: Secondary | ICD-10-CM | POA: Diagnosis not present

## 2019-12-14 DIAGNOSIS — M79674 Pain in right toe(s): Secondary | ICD-10-CM

## 2019-12-14 DIAGNOSIS — M79675 Pain in left toe(s): Secondary | ICD-10-CM

## 2019-12-14 MED ORDER — EFINACONAZOLE 10 % EX SOLN: 1.0000 [drp] | Freq: Every day | CUTANEOUS | 2 refills | Status: DC

## 2019-12-14 NOTE — Patient Instructions (Signed)
Corns and Calluses Corns are small areas of thickened skin that occur on the top, sides, or tip of a toe. They contain a cone-shaped core with a point that can press on a nerve below. This causes pain.  Calluses are areas of thickened skin that can occur anywhere on the body, including the hands, fingers, palms, soles of the feet, and heels. Calluses are usually larger than corns. What are the causes? Corns and calluses are caused by rubbing (friction) or pressure, such as from shoes that are too tight or do not fit properly. What increases the risk? Corns are more likely to develop in people who have misshapen toes (toe deformities), such as hammer toes. Calluses can occur with friction to any area of the skin. They are more likely to develop in people who:  Work with their hands.  Wear shoes that fit poorly, are too tight, or are high-heeled.  Have toe deformities. What are the signs or symptoms? Symptoms of a corn or callus include:  A hard growth on the skin.  Pain or tenderness under the skin.  Redness and swelling.  Increased discomfort while wearing tight-fitting shoes, if your feet are affected. If a corn or callus becomes infected, symptoms may include:  Redness and swelling that gets worse.  Pain.  Fluid, blood, or pus draining from the corn or callus. How is this diagnosed? Corns and calluses may be diagnosed based on your symptoms, your medical history, and a physical exam. How is this treated? Treatment for corns and calluses may include:  Removing the cause of the friction or pressure. This may involve: ? Changing your shoes. ? Wearing shoe inserts (orthotics) or other protective layers in your shoes, such as a corn pad. ? Wearing gloves.  Applying medicine to the skin (topical medicine) to help soften skin in the hardened, thickened areas.  Removing layers of dead skin with a file to reduce the size of the corn or callus.  Removing the corn or callus with a  scalpel or laser.  Taking antibiotic medicines, if your corn or callus is infected.  Having surgery, if a toe deformity is the cause. Follow these instructions at home:   Take over-the-counter and prescription medicines only as told by your health care provider.  If you were prescribed an antibiotic, take it as told by your health care provider. Do not stop taking it even if your condition starts to improve.  Wear shoes that fit well. Avoid wearing high-heeled shoes and shoes that are too tight or too loose.  Wear any padding, protective layers, gloves, or orthotics as told by your health care provider.  Soak your hands or feet and then use a file or pumice stone to soften your corn or callus. Do this as told by your health care provider.  Check your corn or callus every day for symptoms of infection. Contact a health care provider if you:  Notice that your symptoms do not improve with treatment.  Have redness or swelling that gets worse.  Notice that your corn or callus becomes painful.  Have fluid, blood, or pus coming from your corn or callus.  Have new symptoms. Summary  Corns are small areas of thickened skin that occur on the top, sides, or tip of a toe.  Calluses are areas of thickened skin that can occur anywhere on the body, including the hands, fingers, palms, and soles of the feet. Calluses are usually larger than corns.  Corns and calluses are caused by   rubbing (friction) or pressure, such as from shoes that are too tight or do not fit properly.  Treatment may include wearing any padding, protective layers, gloves, or orthotics as told by your health care provider. This information is not intended to replace advice given to you by your health care provider. Make sure you discuss any questions you have with your health care provider. Document Revised: 09/15/2018 Document Reviewed: 04/08/2017 Elsevier Patient Education  2020 Elsevier Inc.  Fungal Nail  Infection A fungal nail infection is a common infection of the toenails or fingernails. This condition affects toenails more often than fingernails. It often affects the great, or big, toes. More than one nail may be infected. The condition can be passed from person to person (is contagious). What are the causes? This condition is caused by a fungus. Several types of fungi can cause the infection. These fungi are common in moist and warm areas. If your hands or feet come into contact with the fungus, it may get into a crack in your fingernail or toenail and cause the infection. What increases the risk? The following factors may make you more likely to develop this condition:  Being male.  Being of older age.  Living with someone who has the fungus.  Walking barefoot in areas where the fungus thrives, such as showers or locker rooms.  Wearing shoes and socks that cause your feet to sweat.  Having a nail injury or a recent nail surgery.  Having certain medical conditions, such as: ? Athlete's foot. ? Diabetes. ? Psoriasis. ? Poor circulation. ? A weak body defense system (immune system). What are the signs or symptoms? Symptoms of this condition include:  A pale spot on the nail.  Thickening of the nail.  A nail that becomes yellow or brown.  A brittle or ragged nail edge.  A crumbling nail.  A nail that has lifted away from the nail bed. How is this diagnosed? This condition is diagnosed with a physical exam. Your health care provider may take a scraping or clipping from your nail to test for the fungus. How is this treated? Treatment is not needed for mild infections. If you have significant nail changes, treatment may include:  Antifungal medicines taken by mouth (orally). You may need to take the medicine for several weeks or several months, and you may not see the results for a long time. These medicines can cause side effects. Ask your health care provider what  problems to watch for.  Antifungal nail polish or nail cream. These may be used along with oral antifungal medicines.  Laser treatment of the nail.  Surgery to remove the nail. This may be needed for the most severe infections. It can take a long time, usually up to a year, for the infection to go away. The infection may also come back. Follow these instructions at home: Medicines  Take or apply over-the-counter and prescription medicines only as told by your health care provider.  Ask your health care provider about using over-the-counter mentholated ointment on your nails. Nail care  Trim your nails often.  Wash and dry your hands and feet every day.  Keep your feet dry: ? Wear absorbent socks, and change your socks frequently. ? Wear shoes that allow air to circulate, such as sandals or canvas tennis shoes. Throw out old shoes.  Do not use artificial nails.  If you go to a nail salon, make sure you choose one that uses clean instruments.  Use antifungal  foot powder on your feet and in your shoes. General instructions  Do not share personal items, such as towels or nail clippers.  Do not walk barefoot in shower rooms or locker rooms.  Wear rubber gloves if you are working with your hands in wet areas.  Keep all follow-up visits as told by your health care provider. This is important. Contact a health care provider if: Your infection is not getting better or it is getting worse after several months. Summary  A fungal nail infection is a common infection of the toenails or fingernails.  Treatment is not needed for mild infections. If you have significant nail changes, treatment may include taking medicine orally and applying medicine to your nails.  It can take a long time, usually up to a year, for the infection to go away. The infection may also come back.  Take or apply over-the-counter and prescription medicines only as told by your health care provider.  Follow  instructions for taking care of your nails to help prevent infection from coming back or spreading. This information is not intended to replace advice given to you by your health care provider. Make sure you discuss any questions you have with your health care provider. Document Revised: 09/16/2018 Document Reviewed: 10/30/2017 Elsevier Patient Education  2020 [employer].

## 2019-12-14 NOTE — Progress Notes (Signed)
  Subjective:  Patient ID: Stephen Walker, male    DOB: [birth year],  MRN: [id number]  Chief Complaint  Patient presents with  . Nail Problem    Thick, long, discolored toenails. x"Years". No treatment.  . Callouses    R hallux, plantar forefoot submet 2 and 4; L hallux, plantar forefoot 1, 2, 4, and 5. 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    69 y.o. male presents with the above complaint. History confirmed with patient.  Complains of bilateral hallux toenails which are thickened and discolored.  Has had no treatment for this thus far.  He has done some research on over-the-counter treatments.  Has been present for years.  He also is a number of calluses on the plantar foot bilaterally is been very painful since he was in the [employer] and wore boots that make these worse.  Objective:  Physical Exam: warm, good capillary refill, no trophic changes or ulcerative lesions, normal DP and PT pulses and normal sensory exam. Left Foot: Hallux nail yellow discolored and thickened with subungual debris, hyperkeratoses present sub-IPJ of the hallux, submet 1, 2, 4, 5 Right Foot: Hallux nail yellow discolored and thickened with subungual debris, hyperkeratoses present sub-IPJ of the hallux submetatarsal 2 cm Assessment:   1. Onychomycosis   2. Pain due to onychomycosis of toenails of both feet   3. Callus of foot   4. Pain in both feet      Plan:  Patient was evaluated and treated and all questions answered.  Discussed the etiology and treatment options for the condition in detail with the patient. Educated patient on the topical and oral treatment options for mycotic nails. Recommended debridement of the nails today. Sharp and mechanical debridement performed of all painful and mycotic nails today. Nails debrided in length and thickness using a nail nipper and a mechanical burr to level of comfort. Discussed treatment options including  appropriate shoe gear. Follow up as needed for painful nails.  Prescription for Jublia was sent to his pharmacy, begin using daily.  All symptomatic hyperkeratoses were safely debrided with a sterile #15 blade to patient's level of comfort without incident. We discussed preventative and palliative care of these lesions including supportive and accommodative shoegear, padding, prefabricated and custom molded accommodative orthoses, use of a pumice stone and lotions/creams daily.  Recommended he use a pumice stone and urea cream at home daily after bathing.  Return in 3 to 4 months for recheck of nails and calluses   Return for re-check nail fungus and calluses.

## 2019-12-15 DIAGNOSIS — I209 Angina pectoris, unspecified: Secondary | ICD-10-CM | POA: Diagnosis not present

## 2019-12-15 DIAGNOSIS — G4700 Insomnia, unspecified: Secondary | ICD-10-CM | POA: Diagnosis not present

## 2019-12-15 DIAGNOSIS — B351 Tinea unguium: Secondary | ICD-10-CM | POA: Diagnosis not present

## 2019-12-15 DIAGNOSIS — I10 Essential (primary) hypertension: Secondary | ICD-10-CM | POA: Diagnosis not present

## 2019-12-19 ENCOUNTER — Ambulatory Visit: Payer: Medicare Other | Admitting: Cardiovascular Disease

## 2020-02-09 ENCOUNTER — Ambulatory Visit: Payer: Medicare Other | Admitting: Cardiovascular Disease

## 2020-02-26 NOTE — Progress Notes (Deleted)
Cardiology Office Note:    Date:  02/26/2020   ID:  Stephen Walker, DOB [birth year], MRN [id number]  PCP:  Georgina Quint, MD  [facility] Cardiologist:  No primary care provider on file.  [facility] Electrophysiologist:  None   Referring MD: Karl Ito, DO   No chief complaint on file. ***  History of Present Illness:    Stephen Walker is a 69 y.o. male with a hx of HTN who was referred by Dr. Allena Katz for evaluation of his chest pain.    Past Medical History:  Diagnosis Date  . Angina pectoris (HCC)   . Caregiver stress 03/19/2012  . Chronic pain of right knee 05/11/2018  . Hypertension   . HYPERTENSION, BENIGN 09/14/2008   Qualifier: Diagnosis of  By: Excell Seltzer, MD, [place]   . Insomnia 05/11/2018  . Situational mixed anxiety and depressive disorder 03/19/2012   Secondary to Caregiver stress and impending death of mother     No past surgical history on file.  Current Medications: No outpatient medications have been marked as taking for the 03/01/20 encounter (Appointment) with Meriam Sprague, MD.     Allergies:   Lisinopril   Social History   Socioeconomic History  . Marital status: Married    Spouse name: Not on file  . Number of children: Not on file  . Years of education: Not on file  . Highest education level: Not on file  Occupational History  . Occupation: retired  Tobacco Use  . Smoking status: Current Every Day Smoker    Packs/day: 0.50    Years: 50.00    Pack years: 25.00    Types: Cigarettes  . Smokeless tobacco: Never Used  Substance and Sexual Activity  . Alcohol use: Never  . Drug use: Never  . Sexual activity: Not Currently    Partners: Female  Other Topics Concern  . Not on file  Social History Narrative   ** Merged History Encounter **       Social Determinants of Health   Financial Resource Strain:   . Difficulty of Paying Living Expenses: Not on file  Food Insecurity:   . Worried About Brewing technologist in the Last Year: Not on file  . Ran Out of Food in the Last Year: Not on file  Transportation Needs:   . Lack of Transportation (Medical): Not on file  . Lack of Transportation (Non-Medical): Not on file  Physical Activity:   . Days of Exercise per Week: Not on file  . Minutes of Exercise per Session: Not on file  Stress:   . Feeling of Stress : Not on file  Social Connections:   . Frequency of Communication with Friends and Family: Not on file  . Frequency of Social Gatherings with Friends and Family: Not on file  . Attends Religious Services: Not on file  . Active Member of Clubs or Organizations: Not on file  . Attends Banker Meetings: Not on file  . Marital Status: Not on file     Family History: The patient's ***family history includes Heart disease in his mother.  ROS:   Please see the history of present illness.    *** All other systems reviewed and are negative.  EKGs/Labs/Other Studies Reviewed:    The following studies were reviewed today: ***  EKG:  EKG is *** ordered today.  The ekg ordered today demonstrates ***  Recent Labs: No results found for requested labs within last  8760 hours.  Recent Lipid Panel No results found for: CHOL, TRIG, HDL, CHOLHDL, VLDL, LDLCALC, LDLDIRECT  Physical Exam:    VS:  There were no vitals taken for this visit.    Wt Readings from Last 3 Encounters:  10/10/19 197 lb (89.4 kg)  06/07/19 197 lb (89.4 kg)  09/15/18 178 lb (80.7 kg)     GEN: *** Well nourished, well developed in no acute distress HEENT: Normal NECK: No JVD; No carotid bruits LYMPHATICS: No lymphadenopathy CARDIAC: ***RRR, no murmurs, rubs, gallops RESPIRATORY:  Clear to auscultation without rales, wheezing or rhonchi  ABDOMEN: Soft, non-tender, non-distended MUSCULOSKELETAL:  No edema; No deformity  SKIN: Warm and dry NEUROLOGIC:  Alert and oriented x 3 PSYCHIATRIC:  Normal affect   ASSESSMENT:    No diagnosis found. PLAN:     In order of problems listed above:  1. Chest pain:   Medication Adjustments/Labs and Tests Ordered: Current medicines are reviewed at length with the patient today.  Concerns regarding medicines are outlined above.  No orders of the defined types were placed in this encounter.  No orders of the defined types were placed in this encounter.   There are no Patient Instructions on file for this visit.   Signed, Meriam Sprague, MD  02/26/2020 3:44 PM    [facility]

## 2020-03-01 ENCOUNTER — Ambulatory Visit: Payer: Medicare Other | Admitting: Cardiology

## 2020-03-07 NOTE — Progress Notes (Signed)
Cardiology Office Note:    Date:  03/08/2020   ID:  Stephen Walker, DOB 26-Jun-1950, MRN [id number]  PCP:  Patient, No Pcp Per  [facility] Cardiologist:  No primary care provider on file.  [facility] Electrophysiologist:  None   Referring MD: Karl Ito, DO    History of Present Illness:    Stephen Walker is a 69 y.o. male with a hx of HTN and current tobacco use (1/2 ppd) who was referred by Dr. Allena Katz for history of chest pain.  Note from Dr. Allena Katz dated 05/20 reviewed. Patient states that he was having mild chest pains about 4 years ago and was seen at the [place] and was placed on metoprolol 100mg  BID. His pain resolved and he has not had recurrence. He states he is able to do regular yard work without limitation and is able to walk 3 flights of stairs in his house without needing to stop. No decrease in exercise capacity. No dizziness, syncope, palpitations, chest pain/pressure with exertion, no SOB. He otherwise feels well. His resting HR is in 50s on metop but patient states he feels well and is able to augment his HR with exercise.  Of note, he has a significant smoking history. Started at age 17 and continues to smoke 1/2 ppd currently.   Family history notable for afib and CAD in his mother (mom had PCI at age 78).  Of note, he has an allergy to lisinopril with lip swelling. Cannot tolerate Walker/ARB.   Past Medical History:  Diagnosis Date  . Angina pectoris (HCC)   . Caregiver stress 03/19/2012  . Chronic pain of right knee 05/11/2018  . Hypertension   . HYPERTENSION, BENIGN 09/14/2008   Qualifier: Diagnosis of  By: 11/14/2008, MD, Excell Seltzer   . Insomnia 05/11/2018  . Situational mixed anxiety and depressive disorder 03/19/2012   Secondary to Caregiver stress and impending death of mother     No past surgical history on file.  Current Medications: Current Meds  Medication Sig  . hydrochlorothiazide (HYDRODIURIL) 25 MG tablet TAKE 1 TABLET BY MOUTH  DAILY  .  hydrOXYzine (ATARAX/VISTARIL) 25 MG tablet Take 2 tablets (50 mg total) by mouth at bedtime as needed.  . traZODone (DESYREL) 50 MG tablet TAKE 1 TABLET BY MOUTH AT  BEDTIME  . [DISCONTINUED] metoprolol tartrate (LOPRESSOR) 100 MG tablet TAKE 1 TABLET BY MOUTH  TWICE DAILY     Allergies:   Lisinopril   Social History   Socioeconomic History  . Marital status: Married    Spouse name: Not on file  . Number of children: Not on file  . Years of education: Not on file  . Highest education level: Not on file  Occupational History  . Occupation: retired  Tobacco Use  . Smoking status: Current Every Day Smoker    Packs/day: 0.50    Years: 50.00    Pack years: 25.00    Types: Cigarettes  . Smokeless tobacco: Never Used  Substance and Sexual Activity  . Alcohol use: Never  . Drug use: Never  . Sexual activity: Not Currently    Partners: Female  Other Topics Concern  . Not on file  Social History Narrative   ** Merged History Encounter **       Social Determinants of Health   Financial Resource Strain:   . Difficulty of Paying Living Expenses: Not on file  Food Insecurity:   . Worried About 05/19/2012 in the Last Year: Not  on file  . Ran Out of Food in the Last Year: Not on file  Transportation Needs:   . Lack of Transportation (Medical): Not on file  . Lack of Transportation (Non-Medical): Not on file  Physical Activity:   . Days of Exercise per Week: Not on file  . Minutes of Exercise per Session: Not on file  Stress:   . Feeling of Stress : Not on file  Social Connections:   . Frequency of Communication with Friends and Family: Not on file  . Frequency of Social Gatherings with Friends and Family: Not on file  . Attends Religious Services: Not on file  . Active Member of Clubs or Organizations: Not on file  . Attends Banker Meetings: Not on file  . Marital Status: Not on file     Family History: The patient's family history includes Heart  disease in his mother.  ROS:   Please see the history of present illness.    The patient denies chest pain, chest pressure, dyspnea at rest or with exertion, palpitations, PND, orthopnea, or leg swelling. Denies cough, fever, chills. Denies nausea, vomiting. Denies syncope or presyncope. Denies dizziness or lightheadedness. Denies snoring.  EKGs/Labs/Other Studies Reviewed:    The following studies were reviewed today: -No cardiac studies in our system  EKG:  EKG is ordered today.  The ekg ordered today demonstrates sinus bradycardia with HR 54  Recent Labs: No results found for requested labs within last 8760 hours.  Recent Lipid Panel No results found for: CHOL, TRIG, HDL, CHOLHDL, VLDL, LDLCALC, LDLDIRECT  Physical Exam:    VS:  BP 110/66   Pulse (!) 54   Ht  (1.854 m)   Wt 190 lb 6.4 oz (86.4 kg)   BMI 25.12 kg/m     Wt Readings from Last 3 Encounters:  03/08/20 190 lb 6.4 oz (86.4 kg)  10/10/19 197 lb (89.4 kg)  06/07/19 197 lb (89.4 kg)     GEN:  Well nourished, well developed in no acute distress HEENT: Normal NECK: No JVD; No carotid bruits LYMPHATICS: No lymphadenopathy CARDIAC: Bradycardic, no murmurs, rubs, gallops RESPIRATORY:  Clear to auscultation without rales, wheezing or rhonchi  ABDOMEN: Soft, non-tender, non-distended MUSCULOSKELETAL:  No edema; No deformity  SKIN: Warm and dry NEUROLOGIC:  Alert and oriented x 3 PSYCHIATRIC:  Normal affect   ASSESSMENT:    1. Essential hypertension   2. Smoking greater than 30 pack years   3. Angina pectoris (HCC)   4. Hyperlipidemia, unspecified hyperlipidemia type    PLAN:    In order of problems listed above:  #Chest Pain:  Patient with history of chest pressure/pain that occurred mainly with exertion 4 years ago. Was placed on metoprolol and pain has resolved. Suspect stable angina however now stable without recurrence of symptoms. -Change metoprolol tartrate to metop  XL daily -Okay with HR  in 50s as patient asymptomatic and able to augment HR with exertion; lower HR also decrease cardiac myocardial oxygen demand and decrease anginal symptoms -Will check CT chest to assess for lung cancer screening and coronary calcium -Start crestor  daily given significant tobacco use history -If Ca+ positive on CT, will plan on low dose ASA -Will check lipid panel at next visit in 3 months  #Hypertension: Well controlled.  -Change metop tartrate to succinate  daily given history of stable angina as metop is good anti-anginal agent -Stop daily hydralazine -Will give amlodipine 2.5mg  daily for the patient to take if  his blood pressure at home >130s/80s on the metop succinate -Continue to monitor blood pressures at home on home cuff  #Tobacco abuse: Significant smoking history. Current 1/2 ppd since age 67.  -Chest CT for lung cancer screening; can also look for coronary calcium -Check abominal ultrasound for AAA screening given >65 and >30 year smoking history -Discussed smoking cessation at length with patent and how this will significantly decrease his risk of CVD, cancer, PAD, stroke. Further information provided in patient hand-out   Medication Adjustments/Labs and Tests Ordered: Current medicines are reviewed at length with the patient today.  Concerns regarding medicines are outlined above.  Orders Placed This Encounter  Procedures  . CT CHEST LUNG CA SCREEN LOW DOSE W/O CM  . Lipid Profile  . Basic Metabolic Panel (BMET)  . Hepatic function panel  . VAS [place] AAA DUPLEX   Meds ordered this encounter  Medications  . metoprolol succinate (TOPROL-XL) 100 MG 24 hr tablet    Sig: Take 1 tablet (100 mg total) by mouth daily. Take with or immediately following a meal.    Dispense:  90 tablet    Refill:  3  . amLODipine (NORVASC) 2.5 MG tablet    Sig: Take 1 tablet (2.5 mg total) by mouth daily as needed (For systolic blood pressure greater than 130 (top number)).     Dispense:  90 tablet    Refill:  3  . rosuvastatin (CRESTOR) 10 MG tablet    Sig: Take 1 tablet (10 mg total) by mouth daily.    Dispense:  90 tablet    Refill:  3    Patient Instructions  Medication Instructions:  Your physician has recommended you make the following change in your medication:  STOP Hydralazine STOP Metoprolol tartrate (Lopressor) START Metoprolol succinate (Toprol XL) 100 mg once daily START Rosuvastatin (Crestor) 10 mg once daily START Amlodipine (Norvasc) 2.5 mg - take one daily if top number is higher than 130  *If you need a refill on your cardiac medications before your next appointment, please call your pharmacy*   Lab Work: Your physician recommends that you return for lab work in 3 months a few days before your office visit with Dr. Shari Prows.  You will need to FAST for this appointment - nothing to eat or drink after midnight the night before except water.  If you have labs (blood work) drawn today and your tests are completely normal, you will receive your results only by: Marland Kitchen MyChart Message (if you have MyChart) OR . A paper copy in the mail If you have any lab test that is abnormal or we need to change your treatment, we will call you to review the results.   Testing/Procedures: Your physician has requested that you have an abdominal aorta duplex. During this test, an ultrasound is used to evaluate the aorta. Allow 30 minutes for this exam. Do not eat after midnight the day before and avoid carbonated beverages  Non-Cardiac CT scanning, (CAT scanning), is a noninvasive, special x-ray that produces cross-sectional images of the body using x-rays and a computer. CT scans help physicians diagnose and treat medical conditions. For some CT exams, a contrast material is used to enhance visibility in the area of the body being studied. CT scans provide greater clarity and reveal more details than regular x-ray exams.    Follow-Up: At [facility], you  and your health needs are our priority.  As part of our continuing mission to provide you with exceptional heart  care, we have created designated Provider Care Teams.  These Care Teams include your primary Cardiologist (physician) and Advanced Practice Providers (APPs -  Physician Assistants and Nurse Practitioners) who all work together to provide you with the care you need, when you need it.  We recommend signing up for the patient portal called "MyChart".  Sign up information is provided on this After Visit Summary.  MyChart is used to connect with patients for Virtual Visits (Telemedicine).  Patients are able to view lab/test results, encounter notes, upcoming appointments, etc.  Non-urgent messages can be sent to your provider as well.   To learn more about what you can do with MyChart, go to [email or web address].    Your next appointment:   3 month(s)  The format for your next appointment:   In Person  Provider:   Laurance Flatten, MD   Other Instructions    Steps to Quit Smoking Smoking tobacco is the leading cause of preventable death. It can affect almost every organ in the body. Smoking puts you and people around you at risk for many serious, long-lasting (chronic) diseases. Quitting smoking can be hard, but it is one of the best things that you can do for your health. It is never too late to quit. How do I get ready to quit? When you decide to quit smoking, make a plan to help you succeed. Before you quit:  Pick a date to quit. Set a date within the next 2 weeks to give you time to prepare.  Write down the reasons why you are quitting. Keep this list in places where you will see it often.  Tell your family, friends, and co-workers that you are quitting. Their support is important.  Talk with your doctor about the choices that may help you quit.  Find out if your health insurance will pay for these treatments.  Know the people, places, things, and activities that make  you want to smoke (triggers). Avoid them. What first steps can I take to quit smoking?  Throw away all cigarettes at home, at work, and in your car.  Throw away the things that you use when you smoke, such as ashtrays and lighters.  Clean your car. Make sure to empty the ashtray.  Clean your home, including curtains and carpets. What can I do to help me quit smoking? Talk with your doctor about taking medicines and seeing a counselor at the same time. You are more likely to succeed when you do both.  If you are pregnant or breastfeeding, talk with your doctor about counseling or other ways to quit smoking. Do not take medicine to help you quit smoking unless your doctor tells you to do so. To quit smoking: Quit right away  Quit smoking totally, instead of slowly cutting back on how much you smoke over a period of time.  Go to counseling. You are more likely to quit if you go to counseling sessions regularly. Take medicine You may take medicines to help you quit. Some medicines need a prescription, and some you can buy over-the-counter. Some medicines may contain a drug called nicotine to replace the nicotine in cigarettes. Medicines may:  Help you to stop having the desire to smoke (cravings).  Help to stop the problems that come when you stop smoking (withdrawal symptoms). Your doctor may ask you to use:  Nicotine patches, gum, or lozenges.  Nicotine inhalers or sprays.  Non-nicotine medicine that is taken by mouth. Find resources Find resources and other  ways to help you quit smoking and remain smoke-free after you quit. These resources are most helpful when you use them often. They include:  Online chats with a Veterinary surgeon.  Phone quitlines.  Printed Materials engineer.  Support groups or group counseling.  Text messaging programs.  Mobile phone apps. Use apps on your mobile phone or tablet that can help you stick to your quit plan. There are many free apps for mobile  phones and tablets as well as websites. Examples include Quit Guide from the [employer] and smokefree.gov  What things can I do to make it easier to quit?   Talk to your family and friends. Ask them to support and encourage you.  Call a phone quitline (1-800-QUIT-NOW), reach out to support groups, or work with a Veterinary surgeon.  Ask people who smoke to not smoke around you.  Avoid places that make you want to smoke, such as: ? Bars. ? Parties. ? Smoke-break areas at work.  Spend time with people who do not smoke.  Lower the stress in your life. Stress can make you want to smoke. Try these things to help your stress: ? Getting regular exercise. ? Doing deep-breathing exercises. ? Doing yoga. ? Meditating. ? Doing a body scan. To do this, close your eyes, focus on one area of your body at a time from head to toe. Notice which parts of your body are tense. Try to relax the muscles in those areas. How will I feel when I quit smoking? Day 1 to 3 weeks Within the first 24 hours, you may start to have some problems that come from quitting tobacco. These problems are very bad 2-3 days after you quit, but they do not often last for more than 2-3 weeks. You may get these symptoms:  Mood swings.  Feeling restless, nervous, angry, or annoyed.  Trouble concentrating.  Dizziness.  Strong desire for high-sugar foods and nicotine.  Weight gain.  Trouble pooping (constipation).  Feeling like you may vomit (nausea).  Coughing or a sore throat.  Changes in how the medicines that you take for other issues work in your body.  Depression.  Trouble sleeping (insomnia). Week 3 and afterward After the first 2-3 weeks of quitting, you may start to notice more positive results, such as:  Better sense of smell and taste.  Less coughing and sore throat.  Slower heart rate.  Lower blood pressure.  Clearer skin.  Better breathing.  Fewer sick days. Quitting smoking can be hard. Do not give up  if you fail the first time. Some people need to try a few times before they succeed. Do your best to stick to your quit plan, and talk with your doctor if you have any questions or concerns. Summary  Smoking tobacco is the leading cause of preventable death. Quitting smoking can be hard, but it is one of the best things that you can do for your health.  When you decide to quit smoking, make a plan to help you succeed.  Quit smoking right away, not slowly over a period of time.  When you start quitting, seek help from your doctor, family, or friends. This information is not intended to replace advice given to you by your health care provider. Make sure you discuss any questions you have with your health care provider. Document Revised: 02/18/2019 Document Reviewed: 08/14/2018 Elsevier Patient Education  2020 [employer].  Exercise recommendations: Goal of exercising for at least 30 minutes a day, at least 5 times per  week.  Please exercise to a moderate exertion.  This means that while exercising it is difficult to speak in full sentences, however you are not so short of breath that you feel you must stop, and not so comfortable that you can carry on a full conversation.  Exertion level should be approximately a 5/10, if 10 is the most exertion you can perform.  Diet recommendations: Recommend a heart healthy diet such as the Mediterranean diet.  This diet consists of plant based foods, healthy fats, lean meats, olive oil.  It suggests limiting the intake of simple carbohydrates such as white breads, pastries, and pastas.  It also limits the amount of red meat, wine, and dairy products such as cheese that one should consume on a daily basis.    Signed, Meriam SpragueHeather E Novice Vrba, MD  03/08/2020 10:01 AM    [facility]

## 2020-03-08 ENCOUNTER — Encounter: Payer: Self-pay | Admitting: Cardiology

## 2020-03-08 ENCOUNTER — Ambulatory Visit (INDEPENDENT_AMBULATORY_CARE_PROVIDER_SITE_OTHER): Payer: Medicare Other | Admitting: Cardiology

## 2020-03-08 ENCOUNTER — Other Ambulatory Visit: Payer: Self-pay

## 2020-03-08 VITALS — BP 110/66 | HR 54 | Ht 73.0 in | Wt 190.4 lb

## 2020-03-08 DIAGNOSIS — E785 Hyperlipidemia, unspecified: Secondary | ICD-10-CM | POA: Diagnosis not present

## 2020-03-08 DIAGNOSIS — F17210 Nicotine dependence, cigarettes, uncomplicated: Secondary | ICD-10-CM | POA: Diagnosis not present

## 2020-03-08 DIAGNOSIS — I10 Essential (primary) hypertension: Secondary | ICD-10-CM | POA: Diagnosis not present

## 2020-03-08 DIAGNOSIS — I209 Angina pectoris, unspecified: Secondary | ICD-10-CM | POA: Diagnosis not present

## 2020-03-08 MED ORDER — ROSUVASTATIN CALCIUM 10 MG PO TABS: 10.0000 mg | ORAL_TABLET | Freq: Every day | ORAL | 3 refills | Status: DC

## 2020-03-08 MED ORDER — METOPROLOL SUCCINATE ER 100 MG PO TB24: 100.0000 mg | ORAL_TABLET | Freq: Every day | ORAL | 3 refills | Status: DC

## 2020-03-08 MED ORDER — AMLODIPINE BESYLATE 2.5 MG PO TABS: 2.5000 mg | ORAL_TABLET | Freq: Every day | ORAL | 3 refills | Status: AC | PRN

## 2020-03-08 NOTE — Patient Instructions (Addendum)
Medication Instructions:  Your physician has recommended you make the following change in your medication:  STOP Hydralazine STOP Metoprolol tartrate (Lopressor) START Metoprolol succinate (Toprol XL) 100 mg once daily START Rosuvastatin (Crestor) 10 mg once daily START Amlodipine (Norvasc) 2.5 mg - take one daily if top number is higher than 130  *If you need a refill on your cardiac medications before your next appointment, please call your pharmacy*   Lab Work: Your physician recommends that you return for lab work in 3 months a few days before your office visit with Dr. Shari Prows.  You will need to FAST for this appointment - nothing to eat or drink after midnight the night before except water.  If you have labs (blood work) drawn today and your tests are completely normal, you will receive your results only by: Marland Kitchen MyChart Message (if you have MyChart) OR . A paper copy in the mail If you have any lab test that is abnormal or we need to change your treatment, we will call you to review the results.   Testing/Procedures: Your physician has requested that you have an abdominal aorta duplex. During this test, an ultrasound is used to evaluate the aorta. Allow 30 minutes for this exam. Do not eat after midnight the day before and avoid carbonated beverages  Non-Cardiac CT scanning, (CAT scanning), is a noninvasive, special x-ray that produces cross-sectional images of the body using x-rays and a computer. CT scans help physicians diagnose and treat medical conditions. For some CT exams, a contrast material is used to enhance visibility in the area of the body being studied. CT scans provide greater clarity and reveal more details than regular x-ray exams.    Follow-Up: At [facility], you and your health needs are our priority.  As part of our continuing mission to provide you with exceptional heart care, we have created designated Provider Care Teams.  These Care Teams include your  primary Cardiologist (physician) and Advanced Practice Providers (APPs -  Physician Assistants and Nurse Practitioners) who all work together to provide you with the care you need, when you need it.  We recommend signing up for the patient portal called "MyChart".  Sign up information is provided on this After Visit Summary.  MyChart is used to connect with patients for Virtual Visits (Telemedicine).  Patients are able to view lab/test results, encounter notes, upcoming appointments, etc.  Non-urgent messages can be sent to your provider as well.   To learn more about what you can do with MyChart, go to [email or web address].    Your next appointment:   3 month(s)  The format for your next appointment:   In Person  Provider:   Laurance Flatten, MD   Other Instructions    Steps to Quit Smoking Smoking tobacco is the leading cause of preventable death. It can affect almost every organ in the body. Smoking puts you and people around you at risk for many serious, long-lasting (chronic) diseases. Quitting smoking can be hard, but it is one of the best things that you can do for your health. It is never too late to quit. How do I get ready to quit? When you decide to quit smoking, make a plan to help you succeed. Before you quit:  Pick a date to quit. Set a date within the next 2 weeks to give you time to prepare.  Write down the reasons why you are quitting. Keep this list in places where you will see it often.  Tell  your family, friends, and co-workers that you are quitting. Their support is important.  Talk with your doctor about the choices that may help you quit.  Find out if your health insurance will pay for these treatments.  Know the people, places, things, and activities that make you want to smoke (triggers). Avoid them. What first steps can I take to quit smoking?  Throw away all cigarettes at home, at work, and in your car.  Throw away the things that you use when you  smoke, such as ashtrays and lighters.  Clean your car. Make sure to empty the ashtray.  Clean your home, including curtains and carpets. What can I do to help me quit smoking? Talk with your doctor about taking medicines and seeing a counselor at the same time. You are more likely to succeed when you do both.  If you are pregnant or breastfeeding, talk with your doctor about counseling or other ways to quit smoking. Do not take medicine to help you quit smoking unless your doctor tells you to do so. To quit smoking: Quit right away  Quit smoking totally, instead of slowly cutting back on how much you smoke over a period of time.  Go to counseling. You are more likely to quit if you go to counseling sessions regularly. Take medicine You may take medicines to help you quit. Some medicines need a prescription, and some you can buy over-the-counter. Some medicines may contain a drug called nicotine to replace the nicotine in cigarettes. Medicines may:  Help you to stop having the desire to smoke (cravings).  Help to stop the problems that come when you stop smoking (withdrawal symptoms). Your doctor may ask you to use:  Nicotine patches, gum, or lozenges.  Nicotine inhalers or sprays.  Non-nicotine medicine that is taken by mouth. Find resources Find resources and other ways to help you quit smoking and remain smoke-free after you quit. These resources are most helpful when you use them often. They include:  Online chats with a Veterinary surgeon.  Phone quitlines.  Printed Materials engineer.  Support groups or group counseling.  Text messaging programs.  Mobile phone apps. Use apps on your mobile phone or tablet that can help you stick to your quit plan. There are many free apps for mobile phones and tablets as well as websites. Examples include Quit Guide from the [employer] and smokefree.gov  What things can I do to make it easier to quit?   Talk to your family and friends. Ask them to  support and encourage you.  Call a phone quitline (1-800-QUIT-NOW), reach out to support groups, or work with a Veterinary surgeon.  Ask people who smoke to not smoke around you.  Avoid places that make you want to smoke, such as: ? Bars. ? Parties. ? Smoke-break areas at work.  Spend time with people who do not smoke.  Lower the stress in your life. Stress can make you want to smoke. Try these things to help your stress: ? Getting regular exercise. ? Doing deep-breathing exercises. ? Doing yoga. ? Meditating. ? Doing a body scan. To do this, close your eyes, focus on one area of your body at a time from head to toe. Notice which parts of your body are tense. Try to relax the muscles in those areas. How will I feel when I quit smoking? Day 1 to 3 weeks Within the first 24 hours, you may start to have some problems that come from quitting tobacco. These problems are  very bad 2-3 days after you quit, but they do not often last for more than 2-3 weeks. You may get these symptoms:  Mood swings.  Feeling restless, nervous, angry, or annoyed.  Trouble concentrating.  Dizziness.  Strong desire for high-sugar foods and nicotine.  Weight gain.  Trouble pooping (constipation).  Feeling like you may vomit (nausea).  Coughing or a sore throat.  Changes in how the medicines that you take for other issues work in your body.  Depression.  Trouble sleeping (insomnia). Week 3 and afterward After the first 2-3 weeks of quitting, you may start to notice more positive results, such as:  Better sense of smell and taste.  Less coughing and sore throat.  Slower heart rate.  Lower blood pressure.  Clearer skin.  Better breathing.  Fewer sick days. Quitting smoking can be hard. Do not give up if you fail the first time. Some people need to try a few times before they succeed. Do your best to stick to your quit plan, and talk with your doctor if you have any questions or  concerns. Summary  Smoking tobacco is the leading cause of preventable death. Quitting smoking can be hard, but it is one of the best things that you can do for your health.  When you decide to quit smoking, make a plan to help you succeed.  Quit smoking right away, not slowly over a period of time.  When you start quitting, seek help from your doctor, family, or friends. This information is not intended to replace advice given to you by your health care provider. Make sure you discuss any questions you have with your health care provider. Document Revised: 02/18/2019 Document Reviewed: 08/14/2018 Elsevier Patient Education  2020 [employer].  Exercise recommendations: Goal of exercising for at least 30 minutes a day, at least 5 times per week.  Please exercise to a moderate exertion.  This means that while exercising it is difficult to speak in full sentences, however you are not so short of breath that you feel you must stop, and not so comfortable that you can carry on a full conversation.  Exertion level should be approximately a 5/10, if 10 is the most exertion you can perform.  Diet recommendations: Recommend a heart healthy diet such as the Mediterranean diet.  This diet consists of plant based foods, healthy fats, lean meats, olive oil.  It suggests limiting the intake of simple carbohydrates such as white breads, pastries, and pastas.  It also limits the amount of red meat, wine, and dairy products such as cheese that one should consume on a daily basis.

## 2020-03-12 ENCOUNTER — Inpatient Hospital Stay (HOSPITAL_COMMUNITY): Admission: RE | Admit: 2020-03-12 | Payer: Medicare Other | Source: Ambulatory Visit

## 2020-03-12 NOTE — Addendum Note (Signed)
Addended by: Burnetta Sabin on: 03/12/2020 07:38 AM   Modules accepted: Orders

## 2020-03-21 ENCOUNTER — Inpatient Hospital Stay: Admission: RE | Admit: 2020-03-21 | Payer: Medicare Other | Source: Ambulatory Visit

## 2020-04-04 ENCOUNTER — Ambulatory Visit (HOSPITAL_COMMUNITY)
Admission: RE | Admit: 2020-04-04 | Payer: Medicare Other | Source: Ambulatory Visit | Attending: Cardiovascular Disease | Admitting: Cardiovascular Disease

## 2020-04-10 ENCOUNTER — Ambulatory Visit (HOSPITAL_COMMUNITY)
Admission: RE | Admit: 2020-04-10 | Discharge: 2020-04-10 | Disposition: A | Discharge status: Home / Self Care | Payer: Medicare Other | Source: Ambulatory Visit | Attending: Cardiovascular Disease | Admitting: Cardiovascular Disease

## 2020-04-10 ENCOUNTER — Other Ambulatory Visit: Payer: Self-pay

## 2020-04-10 DIAGNOSIS — F17210 Nicotine dependence, cigarettes, uncomplicated: Secondary | ICD-10-CM

## 2020-04-10 DIAGNOSIS — Z136 Encounter for screening for cardiovascular disorders: Secondary | ICD-10-CM | POA: Insufficient documentation

## 2020-04-19 ENCOUNTER — Other Ambulatory Visit: Payer: Self-pay | Admitting: Emergency Medicine

## 2020-04-19 DIAGNOSIS — I10 Essential (primary) hypertension: Secondary | ICD-10-CM

## 2020-04-19 NOTE — Telephone Encounter (Signed)
  Notes to clinic Is this pt associated with your practice? No PCP listed, however, Dr. Alvy Bimler wrote this rx.

## 2020-04-26 ENCOUNTER — Other Ambulatory Visit: Payer: Self-pay | Admitting: Cardiology

## 2020-04-26 DIAGNOSIS — F17210 Nicotine dependence, cigarettes, uncomplicated: Secondary | ICD-10-CM

## 2020-04-26 DIAGNOSIS — I209 Angina pectoris, unspecified: Secondary | ICD-10-CM

## 2020-05-04 ENCOUNTER — Other Ambulatory Visit: Payer: Self-pay | Admitting: Emergency Medicine

## 2020-05-04 DIAGNOSIS — I10 Essential (primary) hypertension: Secondary | ICD-10-CM

## 2020-05-04 NOTE — Telephone Encounter (Signed)
Requested medications are due for refill today yes (mail order)  Requested medications are on the active medication list yes  Last refill 9/16   Notes to clinic Is this pt associated with your practice? Has no PCP listed, however, Dr. Alvy Bimler wrote these rx, please assess.

## 2020-05-10 ENCOUNTER — Ambulatory Visit: Payer: Medicare Other | Admitting: Podiatry

## 2020-05-17 ENCOUNTER — Inpatient Hospital Stay: Admission: RE | Admit: 2020-05-17 | Payer: Medicare Other | Source: Ambulatory Visit

## 2020-06-08 NOTE — Progress Notes (Deleted)
Cardiology Office Note:    Date:  06/08/2020   ID:  Stephen Walker, DOB [birth year], MRN [id number]  PCP:  Patient, No Pcp Per  [facility] Cardiologist:  No primary care provider on file.  [facility] Electrophysiologist:  None   Referring MD: No ref. provider found    History of Present Illness:    Stephen Walker is a 69 y.o. male with a hx of HTN and current tobacco use who presents for follow-up.  During our last visit on 03/08/30, the patient stated that he was having mild chest pains about 4 years ago and was seen at the [place] and was placed on metoprolol 100mg  BID. His pain resolved and he has not had recurrence. He states he is able to do regular yard work without limitation and is able to walk 3 flights of stairs in his house without needing to stop. No decrease in exercise capacity. No dizziness, syncope, palpitations, chest pain/pressure with exertion, no SOB. He otherwise feels well. His resting HR is in 50s on metop but patient states he feels well and is able to augment his HR with exercise.  During last visit on 03/08/20, we had recommended CT chest for lung cancer screening due to active tobacco use. Started at age 82 and continues to smoke 1/2 ppd currently.   Family history notable for afib and CAD in his mother (mom had PCI at age 65).   Past Medical History:  Diagnosis Date  . Angina pectoris (HCC)   . Caregiver stress 03/19/2012  . Chronic pain of right knee 05/11/2018  . Hypertension   . HYPERTENSION, BENIGN 09/14/2008   Qualifier: Diagnosis of  By: 11/14/2008, MD, Excell Seltzer   . Insomnia 05/11/2018  . Situational mixed anxiety and depressive disorder 03/19/2012   Secondary to Caregiver stress and impending death of mother     No past surgical history on file.  Current Medications: No outpatient medications have been marked as taking for the 06/19/20 encounter (Appointment) with 08/17/20, MD.     Allergies:   Lisinopril   Social  History   Socioeconomic History  . Marital status: Married    Spouse name: Not on file  . Number of children: Not on file  . Years of education: Not on file  . Highest education level: Not on file  Occupational History  . Occupation: retired  Tobacco Use  . Smoking status: Current Every Day Smoker    Packs/day: 0.50    Years: 50.00    Pack years: 25.00    Types: Cigarettes  . Smokeless tobacco: Never Used  Substance and Sexual Activity  . Alcohol use: Never  . Drug use: Never  . Sexual activity: Not Currently    Partners: Female  Other Topics Concern  . Not on file  Social History Narrative   ** Merged History Encounter **       Social Determinants of Health   Financial Resource Strain: Not on file  Food Insecurity: Not on file  Transportation Needs: Not on file  Physical Activity: Not on file  Stress: Not on file  Social Connections: Not on file     Family History: The patient's ***family history includes Heart disease in his mother.  ROS:   Please see the history of present illness.    *** All other systems reviewed and are negative.  EKGs/Labs/Other Studies Reviewed:    The following studies were reviewed today: ***  EKG:  EKG is *** ordered today.  The ekg ordered today demonstrates ***  Recent Labs: No results found for requested labs within last 8760 hours.  Recent Lipid Panel No results found for: CHOL, TRIG, HDL, CHOLHDL, VLDL, LDLCALC, LDLDIRECT   Risk Assessment/Calculations:   {Does this patient have ATRIAL FIBRILLATION?:[phone]}   Physical Exam:    VS:  There were no vitals taken for this visit.    Wt Readings from Last 3 Encounters:  03/08/20 190 lb 6.4 oz (86.4 kg)  10/10/19 197 lb (89.4 kg)  06/07/19 197 lb (89.4 kg)     GEN: *** Well nourished, well developed in no acute distress HEENT: Normal NECK: No JVD; No carotid bruits LYMPHATICS: No lymphadenopathy CARDIAC: ***RRR, no murmurs, rubs, gallops RESPIRATORY:  Clear to  auscultation without rales, wheezing or rhonchi  ABDOMEN: Soft, non-tender, non-distended MUSCULOSKELETAL:  No edema; No deformity  SKIN: Warm and dry NEUROLOGIC:  Alert and oriented x 3 PSYCHIATRIC:  Normal affect   ASSESSMENT:    No diagnosis found. PLAN:    In order of problems listed above:  #Chest Pain:  Patient with history of chest pressure/pain that occurred mainly with exertion 4 years ago. Was placed on metoprolol and pain has resolved. Suspect stable angina however now stable without recurrence of symptoms. -Change metoprolol tartrate to metop 100mg  XL daily -Okay with HR in 50s as patient asymptomatic and able to augment HR with exertion; lower HR also decrease cardiac myocardial oxygen demand and decrease anginal symptoms -Will check CT chest to assess for lung cancer screening and coronary calcium -Start crestor 10mg  daily given significant tobacco use history -If Ca+ positive on CT, will plan on low dose ASA -Will check lipid panel at next visit in 3 months  #Hypertension: Well controlled.  -Change metop tartrate to succinate 100mg  daily given history of stable angina as metop is good anti-anginal agent -Stop daily hydralazine -Will give amlodipine 2.5mg  daily for the patient to take if his blood pressure at home >130s/80s on the metop succinate -Continue to monitor blood pressures at home on home cuff  #Tobacco abuse: Significant smoking history. Current 1/2 ppd since age 43.  -Chest CT for lung cancer screening; can also look for coronary calcium -Check abominal ultrasound for AAA screening given >65 and >30 year smoking history -Discussed smoking cessation at length with patent and how this will significantly decrease his risk of CVD, cancer, PAD, stroke. Further information provided in patient hand-out    {Are you ordering a CV Procedure (e.g. stress test, cath, DCCV, TEE, etc)?   Press F2        :    Medication Adjustments/Labs and Tests  Ordered: Current medicines are reviewed at length with the patient today.  Concerns regarding medicines are outlined above.  No orders of the defined types were placed in this encounter.  No orders of the defined types were placed in this encounter.   There are no Patient Instructions on file for this visit.   Signed, , MD  06/08/2020 4:24 PM    [facility]

## 2020-06-15 ENCOUNTER — Other Ambulatory Visit: Payer: Medicare Other

## 2020-06-19 ENCOUNTER — Ambulatory Visit: Payer: Medicare Other | Admitting: Cardiology

## 2020-06-19 ENCOUNTER — Other Ambulatory Visit: Payer: Self-pay | Admitting: Emergency Medicine

## 2020-06-19 NOTE — Telephone Encounter (Signed)
Requested medication (s) are due for refill today: yes   Requested medication (s) are on the active medication list: yes   Last refill:  06/21/19 #90 3 refills  Future visit scheduled: no  Notes to clinic:  patient needs future visit, no valid encounter within 6 months, do you want courtesy refill?    Requested Prescriptions  Pending Prescriptions Disp Refills   traZODone (DESYREL) 50 MG tablet [Pharmacy Med Name: traZODone HCl 50 MG Oral Tablet] 90 tablet 3    Sig: TAKE 1 TABLET BY MOUTH AT  BEDTIME      Psychiatry: Antidepressants - Serotonin Modulator Failed - 06/19/2020  6:28 PM      Failed - Completed PHQ-2 or PHQ-9 in the last 360 days      Failed - Valid encounter within last 6 months    Recent Outpatient Visits           1 year ago Essential hypertension   [facility] [place], Eilleen Kempf, MD   2 years ago Uncontrolled hypertension   [facility] at [facility], [place], MD

## 2020-07-12 ENCOUNTER — Other Ambulatory Visit: Payer: Self-pay | Admitting: Emergency Medicine

## 2020-07-13 NOTE — Telephone Encounter (Signed)
07/13/2020 - PATIENT REQUESTING A REFILL ON HIS METOPROLOL TARTRATE 100 mg. FELICIA KIRBY HAS REVIEWED AND PATIENT NEEDS AN OFFICE VISIT BEFORE THIS MEDICINE CAN BE FILLED AGAIN. I CALLED BUT HAD TO LEAVE A MESSAGE ON HIS VOICE MAIL TO RETURN MY CALL TO SCHEDULE. MBC

## 2020-07-13 NOTE — Telephone Encounter (Signed)
   Notes to clinic Is this patient associated with your practice, no PCP listed, however, has been seen in your practice, Dr. Elease Hashimoto wrote this particular rx, Please assess.

## 2020-07-13 NOTE — Telephone Encounter (Signed)
Please schedule office visit. No refills until appt is schedule. Patient have not been seen since 2020

## 2020-07-26 ENCOUNTER — Other Ambulatory Visit: Payer: Self-pay | Admitting: Emergency Medicine

## 2020-07-29 ENCOUNTER — Other Ambulatory Visit: Payer: Self-pay | Admitting: Emergency Medicine

## 2020-07-31 ENCOUNTER — Other Ambulatory Visit: Payer: Self-pay | Admitting: Emergency Medicine

## 2020-12-20 ENCOUNTER — Telehealth: Payer: Self-pay | Admitting: Cardiology

## 2020-12-20 NOTE — Telephone Encounter (Signed)
*  STAT* If patient is at the pharmacy, call can be transferred to refill team.   1. Which medications need to be refilled? (please list name of each medication and dose if known) traZODone (DESYREL) 50 MG tablet  2. Which pharmacy/location (including street and city if local pharmacy) is medication to be sent to? [id number] - [place],  - [address] AT [facility] [address]  3. Do they need a 30 day or 90 day supply? 90

## 2020-12-20 NOTE — Telephone Encounter (Signed)
Called pt to inform him that he needed to contact his PCP to refill his medication trazodone. I advised pt, that if he has any other cardiac problems, questions or concerns, to give our office a call. Pt verbalized understanding.

## 2020-12-30 IMAGING — CR DG KNEE COMPLETE 4+V*R*
4 series · 4 of 4 positions shown · non-contrast
Comparison: None.

CLINICAL DATA: Knee pain, no known injury, initial encounter

EXAM:
RIGHT KNEE - COMPLETE 4+ VIEW

[w knee ap right]
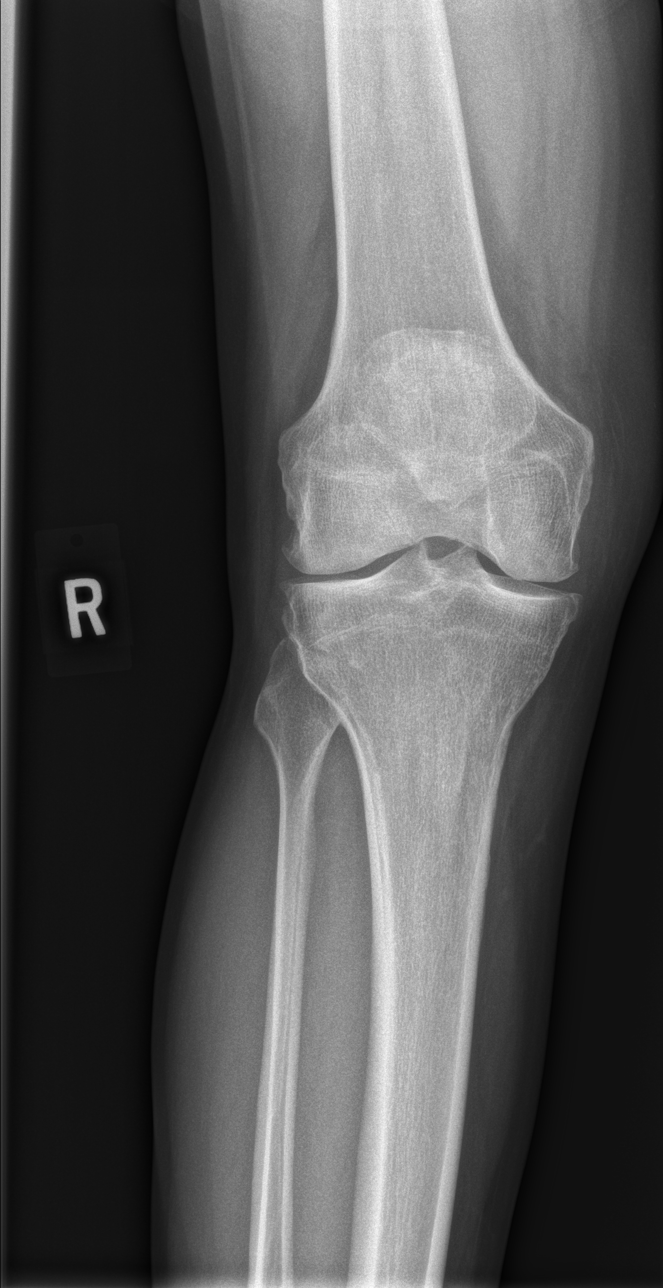

[w knee lat right]
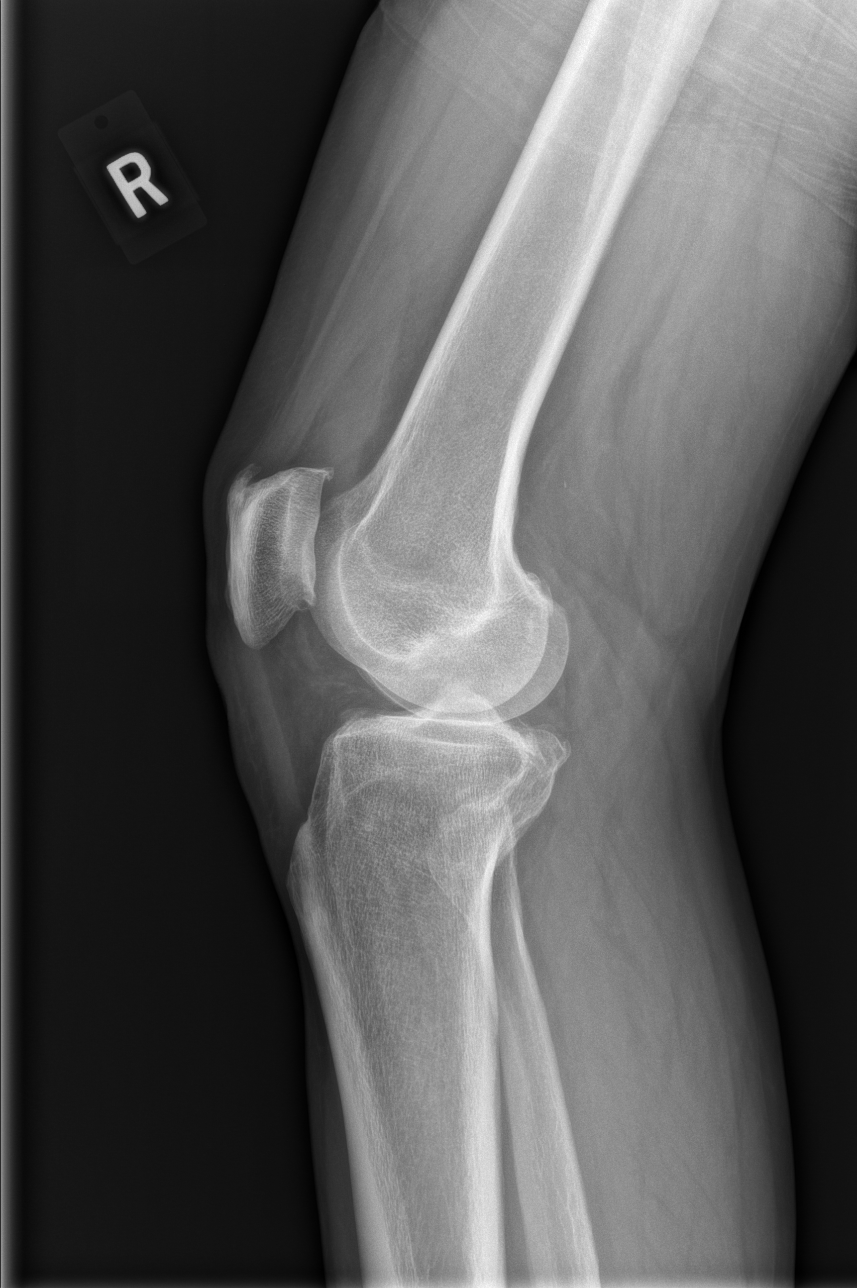

[w knee tunnel pa right]
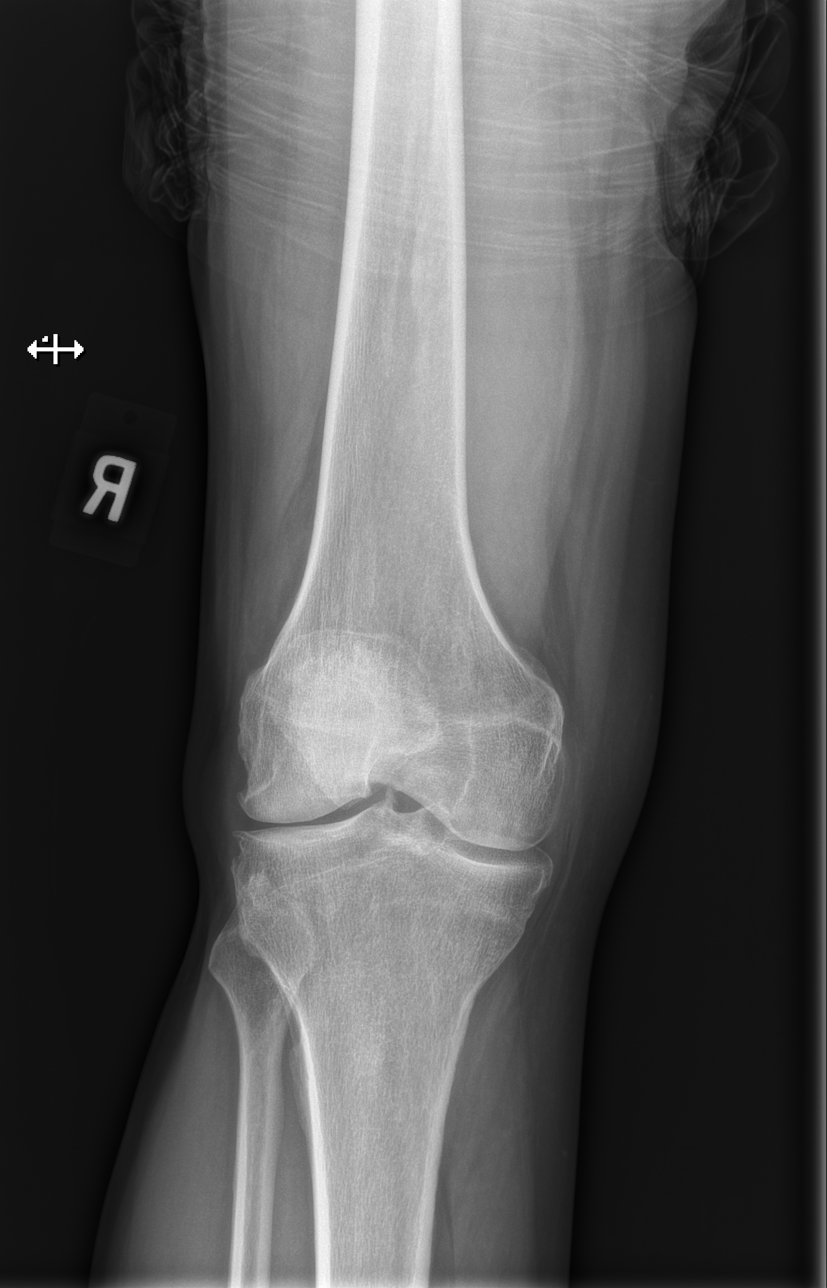

[x knee sunrise right]
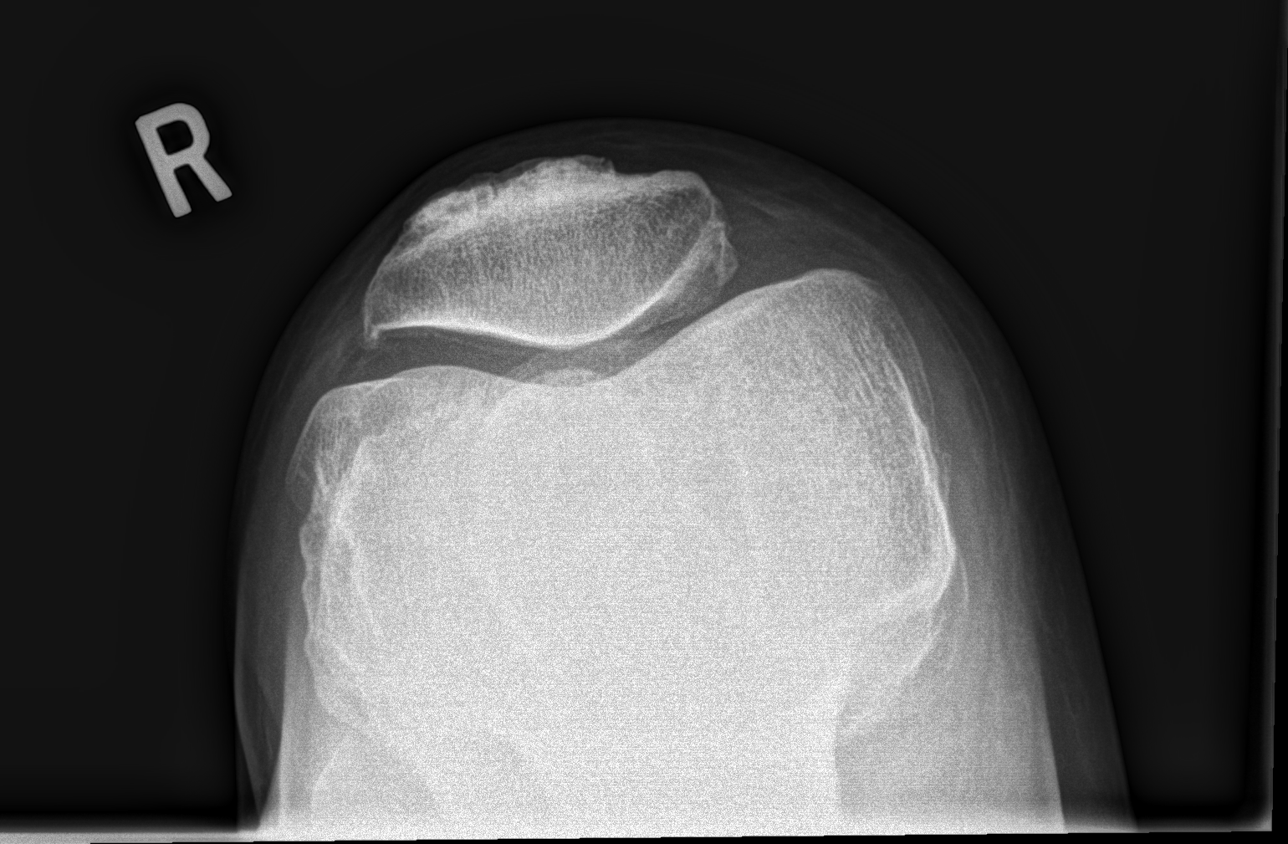

[4 of 4 positions shown; findings below may reference images not displayed]

FINDINGS: Tricompartmental degenerative changes are noted. Small joint
effusion is seen. No acute fracture or dislocation is noted. No soft
tissue abnormality is noted.
IMPRESSION: Degenerative change with small joint effusion. No acute fracture is
noted.

## 2021-03-18 ENCOUNTER — Other Ambulatory Visit: Payer: Self-pay

## 2021-03-18 MED ORDER — ROSUVASTATIN CALCIUM 10 MG PO TABS: 10.0000 mg | ORAL_TABLET | Freq: Every day | ORAL | 0 refills | Status: DC

## 2021-03-18 MED ORDER — METOPROLOL SUCCINATE ER 100 MG PO TB24: 100.0000 mg | ORAL_TABLET | Freq: Every day | ORAL | 0 refills | Status: DC

## 2021-05-06 ENCOUNTER — Other Ambulatory Visit: Payer: Self-pay | Admitting: *Deleted

## 2021-05-06 MED ORDER — METOPROLOL SUCCINATE ER 100 MG PO TB24: 100.0000 mg | ORAL_TABLET | Freq: Every day | ORAL | 0 refills | Status: DC

## 2021-05-06 MED ORDER — ROSUVASTATIN CALCIUM 10 MG PO TABS: 10.0000 mg | ORAL_TABLET | Freq: Every day | ORAL | 0 refills | Status: AC

## 2021-10-26 DIAGNOSIS — I10 Essential (primary) hypertension: Secondary | ICD-10-CM | POA: Diagnosis not present

## 2021-10-26 DIAGNOSIS — G4700 Insomnia, unspecified: Secondary | ICD-10-CM | POA: Diagnosis not present

## 2022-04-28 DIAGNOSIS — G4700 Insomnia, unspecified: Secondary | ICD-10-CM | POA: Diagnosis not present

## 2022-04-28 DIAGNOSIS — I10 Essential (primary) hypertension: Secondary | ICD-10-CM | POA: Diagnosis not present

## 2022-11-26 DIAGNOSIS — R609 Edema, unspecified: Secondary | ICD-10-CM | POA: Diagnosis not present

## 2022-11-26 DIAGNOSIS — G4700 Insomnia, unspecified: Secondary | ICD-10-CM | POA: Diagnosis not present

## 2022-11-26 DIAGNOSIS — I10 Essential (primary) hypertension: Secondary | ICD-10-CM | POA: Diagnosis not present

## 2023-01-05 DIAGNOSIS — E7800 Pure hypercholesterolemia, unspecified: Secondary | ICD-10-CM | POA: Diagnosis not present

## 2023-01-05 DIAGNOSIS — R5383 Other fatigue: Secondary | ICD-10-CM | POA: Diagnosis not present

## 2023-01-05 DIAGNOSIS — F5101 Primary insomnia: Secondary | ICD-10-CM | POA: Diagnosis not present

## 2023-01-05 DIAGNOSIS — E559 Vitamin D deficiency, unspecified: Secondary | ICD-10-CM | POA: Diagnosis not present

## 2023-01-05 DIAGNOSIS — R0602 Shortness of breath: Secondary | ICD-10-CM | POA: Diagnosis not present

## 2023-01-05 DIAGNOSIS — N400 Enlarged prostate without lower urinary tract symptoms: Secondary | ICD-10-CM | POA: Diagnosis not present

## 2023-01-05 DIAGNOSIS — Z0000 Encounter for general adult medical examination without abnormal findings: Secondary | ICD-10-CM | POA: Diagnosis not present

## 2023-01-05 DIAGNOSIS — I10 Essential (primary) hypertension: Secondary | ICD-10-CM | POA: Diagnosis not present

## 2023-01-12 DIAGNOSIS — E559 Vitamin D deficiency, unspecified: Secondary | ICD-10-CM | POA: Diagnosis not present

## 2023-01-12 DIAGNOSIS — Z125 Encounter for screening for malignant neoplasm of prostate: Secondary | ICD-10-CM | POA: Diagnosis not present

## 2023-01-12 DIAGNOSIS — E7800 Pure hypercholesterolemia, unspecified: Secondary | ICD-10-CM | POA: Diagnosis not present

## 2023-01-12 DIAGNOSIS — R5383 Other fatigue: Secondary | ICD-10-CM | POA: Diagnosis not present

## 2023-01-12 DIAGNOSIS — Z1159 Encounter for screening for other viral diseases: Secondary | ICD-10-CM | POA: Diagnosis not present

## 2023-01-12 DIAGNOSIS — Z131 Encounter for screening for diabetes mellitus: Secondary | ICD-10-CM | POA: Diagnosis not present

## 2023-01-19 DIAGNOSIS — R768 Other specified abnormal immunological findings in serum: Secondary | ICD-10-CM | POA: Diagnosis not present

## 2023-01-19 DIAGNOSIS — I10 Essential (primary) hypertension: Secondary | ICD-10-CM | POA: Diagnosis not present

## 2023-01-19 DIAGNOSIS — E119 Type 2 diabetes mellitus without complications: Secondary | ICD-10-CM | POA: Diagnosis not present

## 2023-01-19 DIAGNOSIS — N183 Chronic kidney disease, stage 3 unspecified: Secondary | ICD-10-CM | POA: Diagnosis not present

## 2023-01-19 DIAGNOSIS — E7800 Pure hypercholesterolemia, unspecified: Secondary | ICD-10-CM | POA: Diagnosis not present

## 2023-01-19 DIAGNOSIS — E559 Vitamin D deficiency, unspecified: Secondary | ICD-10-CM | POA: Diagnosis not present

## 2023-02-12 ENCOUNTER — Encounter (HOSPITAL_COMMUNITY): Payer: Self-pay | Admitting: Cardiology

## 2023-02-12 ENCOUNTER — Telehealth: Payer: Self-pay | Admitting: Cardiology

## 2023-02-12 NOTE — Telephone Encounter (Signed)
Called the pt back.  Pt has not been seen by our practice in over 3 years.  He would be a new patient at this time.   Pt states he has all his care, including his cardiac care, managed by Dr. Stefani Dama.  Pt states Dr. Elonda Husky was the one who ordered his renal US to be done by our [facility] PV department.  Pt states he called our PV Scheduler earlier this morning and cancelled his renal US for tomorrow, for he is currently sick and will call back to reschedule.  Pt states he plans on calling Dr. Elonda Husky today and informing her that he had to cancel the test, so she is not expecting his results to come through.  Pt states he is being managed by Dr. Elonda Husky for his cardiac needs.  He does not wish to pursue in scheduling a new pt appt with our office to get re-established, being Dr. Elonda Husky is managing his cardiac needs.   Advised the pt we are always here if he wants to re-establish with [place], and best wishes provided.   Advised him to go ahead and reach out to Dr. Dayna Barker office to make them aware he cancelled his renal US they ordered for him to have done on 9/6.  Pt verbalized understanding and agrees with this plan.   OFF NOTE:  PV Scheduler Mirna Mires went ahead and cancelled his renal US on 9/6 for reasons mentioned.

## 2023-02-12 NOTE — Telephone Encounter (Signed)
Patient called stating he has been sick for about a week and has had no energy and wants to cancel his renal test for 9/6.  Patient stated he will call back to reschedule when feeling better.

## 2023-02-13 ENCOUNTER — Encounter (HOSPITAL_COMMUNITY): Payer: Self-pay

## 2023-02-13 ENCOUNTER — Ambulatory Visit (HOSPITAL_COMMUNITY): Admission: RE | Admit: 2023-02-13 | Payer: Medicare HMO | Source: Ambulatory Visit

## 2023-03-27 LAB — COLOGUARD

## 2023-04-15 ENCOUNTER — Emergency Department (HOSPITAL_COMMUNITY): Payer: Medicare HMO

## 2023-04-15 ENCOUNTER — Encounter (HOSPITAL_COMMUNITY): Payer: Self-pay

## 2023-04-15 ENCOUNTER — Other Ambulatory Visit: Payer: Self-pay

## 2023-04-15 ENCOUNTER — Observation Stay (HOSPITAL_COMMUNITY): Payer: Medicare HMO

## 2023-04-15 ENCOUNTER — Inpatient Hospital Stay (HOSPITAL_COMMUNITY)
Admission: EM | Admit: 2023-04-15 | Discharge: 2023-04-19 | DRG: 175 | Disposition: A | Discharge status: Home / Self Care | Payer: Medicare HMO | Attending: Family Medicine | Admitting: Family Medicine

## 2023-04-15 DIAGNOSIS — F1193 Opioid use, unspecified with withdrawal: Principal | ICD-10-CM

## 2023-04-15 DIAGNOSIS — I10 Essential (primary) hypertension: Secondary | ICD-10-CM | POA: Diagnosis not present

## 2023-04-15 DIAGNOSIS — I471 Supraventricular tachycardia, unspecified: Secondary | ICD-10-CM | POA: Diagnosis present

## 2023-04-15 DIAGNOSIS — N1832 Chronic kidney disease, stage 3b: Secondary | ICD-10-CM | POA: Diagnosis present

## 2023-04-15 DIAGNOSIS — J9600 Acute respiratory failure, unspecified whether with hypoxia or hypercapnia: Secondary | ICD-10-CM | POA: Diagnosis not present

## 2023-04-15 DIAGNOSIS — R0602 Shortness of breath: Secondary | ICD-10-CM | POA: Diagnosis not present

## 2023-04-15 DIAGNOSIS — E876 Hypokalemia: Secondary | ICD-10-CM | POA: Diagnosis not present

## 2023-04-15 DIAGNOSIS — I071 Rheumatic tricuspid insufficiency: Secondary | ICD-10-CM | POA: Diagnosis present

## 2023-04-15 DIAGNOSIS — Z79899 Other long term (current) drug therapy: Secondary | ICD-10-CM | POA: Diagnosis not present

## 2023-04-15 DIAGNOSIS — I517 Cardiomegaly: Secondary | ICD-10-CM | POA: Diagnosis not present

## 2023-04-15 DIAGNOSIS — F17210 Nicotine dependence, cigarettes, uncomplicated: Secondary | ICD-10-CM | POA: Diagnosis present

## 2023-04-15 DIAGNOSIS — Z888 Allergy status to other drugs, medicaments and biological substances status: Secondary | ICD-10-CM

## 2023-04-15 DIAGNOSIS — D72829 Elevated white blood cell count, unspecified: Secondary | ICD-10-CM | POA: Diagnosis present

## 2023-04-15 DIAGNOSIS — Z634 Disappearance and death of family member: Secondary | ICD-10-CM | POA: Diagnosis not present

## 2023-04-15 DIAGNOSIS — R5383 Other fatigue: Secondary | ICD-10-CM | POA: Diagnosis not present

## 2023-04-15 DIAGNOSIS — R0902 Hypoxemia: Secondary | ICD-10-CM | POA: Diagnosis not present

## 2023-04-15 DIAGNOSIS — E7800 Pure hypercholesterolemia, unspecified: Secondary | ICD-10-CM | POA: Diagnosis not present

## 2023-04-15 DIAGNOSIS — I129 Hypertensive chronic kidney disease with stage 1 through stage 4 chronic kidney disease, or unspecified chronic kidney disease: Secondary | ICD-10-CM | POA: Diagnosis not present

## 2023-04-15 DIAGNOSIS — I214 Non-ST elevation (NSTEMI) myocardial infarction: Secondary | ICD-10-CM | POA: Diagnosis present

## 2023-04-15 DIAGNOSIS — G4700 Insomnia, unspecified: Secondary | ICD-10-CM | POA: Diagnosis present

## 2023-04-15 DIAGNOSIS — Z8249 Family history of ischemic heart disease and other diseases of the circulatory system: Secondary | ICD-10-CM

## 2023-04-15 DIAGNOSIS — E559 Vitamin D deficiency, unspecified: Secondary | ICD-10-CM | POA: Diagnosis not present

## 2023-04-15 DIAGNOSIS — Z7901 Long term (current) use of anticoagulants: Secondary | ICD-10-CM | POA: Diagnosis not present

## 2023-04-15 DIAGNOSIS — E8770 Fluid overload, unspecified: Secondary | ICD-10-CM | POA: Diagnosis present

## 2023-04-15 DIAGNOSIS — Z853 Personal history of malignant neoplasm of breast: Secondary | ICD-10-CM | POA: Diagnosis not present

## 2023-04-15 DIAGNOSIS — F1123 Opioid dependence with withdrawal: Secondary | ICD-10-CM | POA: Diagnosis not present

## 2023-04-15 DIAGNOSIS — Z636 Dependent relative needing care at home: Secondary | ICD-10-CM

## 2023-04-15 DIAGNOSIS — R000 Tachycardia, unspecified: Secondary | ICD-10-CM | POA: Diagnosis not present

## 2023-04-15 DIAGNOSIS — I2692 Saddle embolus of pulmonary artery without acute cor pulmonale: Principal | ICD-10-CM | POA: Diagnosis present

## 2023-04-15 DIAGNOSIS — R768 Other specified abnormal immunological findings in serum: Secondary | ICD-10-CM | POA: Diagnosis not present

## 2023-04-15 DIAGNOSIS — I252 Old myocardial infarction: Secondary | ICD-10-CM | POA: Diagnosis not present

## 2023-04-15 DIAGNOSIS — E739 Lactose intolerance, unspecified: Secondary | ICD-10-CM | POA: Diagnosis present

## 2023-04-15 DIAGNOSIS — M7989 Other specified soft tissue disorders: Secondary | ICD-10-CM | POA: Diagnosis not present

## 2023-04-15 DIAGNOSIS — R079 Chest pain, unspecified: Secondary | ICD-10-CM | POA: Diagnosis not present

## 2023-04-15 DIAGNOSIS — E785 Hyperlipidemia, unspecified: Secondary | ICD-10-CM | POA: Diagnosis present

## 2023-04-15 DIAGNOSIS — I2602 Saddle embolus of pulmonary artery with acute cor pulmonale: Secondary | ICD-10-CM | POA: Diagnosis not present

## 2023-04-15 DIAGNOSIS — R778 Other specified abnormalities of plasma proteins: Secondary | ICD-10-CM | POA: Diagnosis not present

## 2023-04-15 DIAGNOSIS — E119 Type 2 diabetes mellitus without complications: Secondary | ICD-10-CM | POA: Diagnosis not present

## 2023-04-15 DIAGNOSIS — N183 Chronic kidney disease, stage 3 unspecified: Secondary | ICD-10-CM | POA: Diagnosis not present

## 2023-04-15 DIAGNOSIS — R9389 Abnormal findings on diagnostic imaging of other specified body structures: Secondary | ICD-10-CM | POA: Diagnosis not present

## 2023-04-15 DIAGNOSIS — R7989 Other specified abnormal findings of blood chemistry: Secondary | ICD-10-CM

## 2023-04-15 DIAGNOSIS — J439 Emphysema, unspecified: Secondary | ICD-10-CM | POA: Diagnosis not present

## 2023-04-15 DIAGNOSIS — R112 Nausea with vomiting, unspecified: Secondary | ICD-10-CM | POA: Diagnosis not present

## 2023-04-15 LAB — CBC
HCT: 44.5 % (ref 39.0–52.0)
Hemoglobin: 15.5 g/dL (ref 13.0–17.0)
MCH: 31.7 pg (ref 26.0–34.0)
MCHC: 34.8 g/dL (ref 30.0–36.0)
MCV: 91 fL (ref 80.0–100.0)
Platelets: 217 10*3/uL (ref 150–400)
RBC: 4.89 MIL/uL (ref 4.22–5.81)
RDW: 14 % (ref 11.5–15.5)
WBC: 15 10*3/uL — ABNORMAL HIGH (ref 4.0–10.5)
nRBC: 0 % (ref 0.0–0.2)

## 2023-04-15 LAB — COMPREHENSIVE METABOLIC PANEL
ALT: 26 U/L (ref 0–44)
AST: 38 U/L (ref 15–41)
Albumin: 4.1 g/dL (ref 3.5–5.0)
Alkaline Phosphatase: 70 U/L (ref 38–126)
Anion gap: 18 — ABNORMAL HIGH (ref 5–15)
BUN: 21 mg/dL (ref 8–23)
CO2: 21 mmol/L — ABNORMAL LOW (ref 22–32)
Calcium: 10.2 mg/dL (ref 8.9–10.3)
Chloride: 95 mmol/L — ABNORMAL LOW (ref 98–111)
Creatinine, Ser: 1.38 mg/dL — ABNORMAL HIGH (ref 0.61–1.24)
GFR, Estimated: 55 mL/min — ABNORMAL LOW (ref >60)
Glucose, Bld: 163 mg/dL — ABNORMAL HIGH (ref 70–99)
Potassium: 3.6 mmol/L (ref 3.5–5.1)
Sodium: 134 mmol/L — ABNORMAL LOW (ref 135–145)
Total Bilirubin: 1.2 mg/dL — ABNORMAL HIGH (ref <1.2)
Total Protein: 9.3 g/dL — ABNORMAL HIGH (ref 6.5–8.1)

## 2023-04-15 LAB — LIPASE, BLOOD: Lipase: 39 U/L (ref 11–51)

## 2023-04-15 LAB — TROPONIN I (HIGH SENSITIVITY)
Troponin I (High Sensitivity): 1474 ng/L (ref <18)
Troponin I (High Sensitivity): 1367 ng/L (ref <18)

## 2023-04-15 LAB — RAPID URINE DRUG SCREEN, HOSP PERFORMED
Amphetamines: NOT DETECTED
Barbiturates: NOT DETECTED
Benzodiazepines: NOT DETECTED
Cocaine: NOT DETECTED
Opiates: NOT DETECTED
Tetrahydrocannabinol: POSITIVE — AB

## 2023-04-15 LAB — HIV ANTIBODY (ROUTINE TESTING W REFLEX): HIV Screen 4th Generation wRfx: NONREACTIVE

## 2023-04-15 MED ORDER — HEPARIN BOLUS VIA INFUSION
4000.0000 [IU] | Freq: Once | INTRAVENOUS | Status: AC
Start: 2023-04-15 — End: 2023-04-15
  Administered 2023-04-15: 4000 [IU] via INTRAVENOUS
  Filled 2023-04-15: qty 4000

## 2023-04-15 MED ORDER — NAPROXEN 500 MG PO TABS
500.0000 mg | ORAL_TABLET | Freq: Two times a day (BID) | ORAL | Status: DC | PRN
Start: 2023-04-15 — End: 2023-04-17

## 2023-04-15 MED ORDER — DICYCLOMINE HCL 20 MG PO TABS
20.0000 mg | ORAL_TABLET | Freq: Four times a day (QID) | ORAL | Status: DC | PRN
  Administered 2023-04-15: 20 mg via ORAL
  Filled 2023-04-15 (×2): qty 1

## 2023-04-15 MED ORDER — ONDANSETRON HCL 4 MG PO TABS
4.0000 mg | ORAL_TABLET | Freq: Four times a day (QID) | ORAL | Status: DC | PRN
  Administered 2023-04-16: 4 mg via ORAL
  Filled 2023-04-15: qty 1

## 2023-04-15 MED ORDER — SODIUM CHLORIDE 0.9 % IV BOLUS
1000.0000 mL | Freq: Once | INTRAVENOUS | Status: AC
  Administered 2023-04-15: 1000 mL via INTRAVENOUS

## 2023-04-15 MED ORDER — LORAZEPAM 2 MG/ML IJ SOLN
1.0000 mg | Freq: Once | INTRAMUSCULAR | Status: AC
  Administered 2023-04-15: 1 mg via INTRAVENOUS
  Filled 2023-04-15: qty 1

## 2023-04-15 MED ORDER — ACETAMINOPHEN 325 MG PO TABS
650.0000 mg | ORAL_TABLET | Freq: Four times a day (QID) | ORAL | Status: DC | PRN
  Administered 2023-04-16 – 2023-04-18 (×3): 650 mg via ORAL
  Filled 2023-04-15 (×4): qty 2

## 2023-04-15 MED ORDER — ONDANSETRON HCL 4 MG/2ML IJ SOLN: 4.0000 mg | Freq: Four times a day (QID) | INTRAMUSCULAR | Status: DC | PRN

## 2023-04-15 MED ORDER — HYDROXYZINE HCL 25 MG PO TABS
25.0000 mg | ORAL_TABLET | Freq: Four times a day (QID) | ORAL | Status: DC | PRN
  Administered 2023-04-15: 25 mg via ORAL
  Filled 2023-04-15: qty 1

## 2023-04-15 MED ORDER — ALBUTEROL SULFATE (2.5 MG/3ML) 0.083% IN NEBU: 2.5000 mg | INHALATION_SOLUTION | RESPIRATORY_TRACT | Status: DC | PRN

## 2023-04-15 MED ORDER — TRAZODONE HCL 50 MG PO TABS
50.0000 mg | ORAL_TABLET | Freq: Every evening | ORAL | Status: DC | PRN
Start: 2023-04-15 — End: 2023-04-17
  Administered 2023-04-15 – 2023-04-16 (×2): 50 mg via ORAL
  Filled 2023-04-15 (×2): qty 1

## 2023-04-15 MED ORDER — DIPHENHYDRAMINE HCL 25 MG PO CAPS
50.0000 mg | ORAL_CAPSULE | ORAL | Status: DC | PRN
  Administered 2023-04-15 – 2023-04-17 (×3): 50 mg via ORAL
  Filled 2023-04-15 (×3): qty 2

## 2023-04-15 MED ORDER — HEPARIN (PORCINE) 25000 UT/250ML-% IV SOLN
1100.0000 [IU]/h | INTRAVENOUS | Status: DC
  Administered 2023-04-15 – 2023-04-16 (×2): 1100 [IU]/h via INTRAVENOUS
  Filled 2023-04-15 (×2): qty 250

## 2023-04-15 MED ORDER — METHOCARBAMOL 500 MG PO TABS
500.0000 mg | ORAL_TABLET | Freq: Three times a day (TID) | ORAL | Status: DC | PRN
  Administered 2023-04-15: 500 mg via ORAL
  Filled 2023-04-15: qty 1

## 2023-04-15 MED ORDER — IOHEXOL 350 MG/ML SOLN
80.0000 mL | Freq: Once | INTRAVENOUS | Status: AC | PRN
  Administered 2023-04-15: 80 mL via INTRAVENOUS

## 2023-04-15 MED ORDER — ASPIRIN 81 MG PO CHEW
324.0000 mg | CHEWABLE_TABLET | Freq: Once | ORAL | Status: AC
  Administered 2023-04-15: 324 mg via ORAL
  Filled 2023-04-15: qty 4

## 2023-04-15 MED ORDER — ONDANSETRON HCL 4 MG/2ML IJ SOLN
4.0000 mg | Freq: Once | INTRAMUSCULAR | Status: AC
Start: 2023-04-15 — End: 2023-04-15
  Administered 2023-04-15: 4 mg via INTRAVENOUS
  Filled 2023-04-15: qty 2

## 2023-04-15 MED ORDER — ACETAMINOPHEN 650 MG RE SUPP
650.0000 mg | Freq: Four times a day (QID) | RECTAL | Status: DC | PRN
  Administered 2023-04-15: 650 mg via RECTAL

## 2023-04-15 MED ORDER — LORAZEPAM 1 MG PO TABS
1.0000 mg | ORAL_TABLET | ORAL | Status: DC | PRN
  Administered 2023-04-15: 1 mg via ORAL
  Filled 2023-04-15: qty 1

## 2023-04-15 MED ORDER — ROSUVASTATIN CALCIUM 5 MG PO TABS
5.0000 mg | ORAL_TABLET | Freq: Every day | ORAL | Status: DC
  Administered 2023-04-15 – 2023-04-19 (×5): 5 mg via ORAL
  Filled 2023-04-15 (×5): qty 1

## 2023-04-15 MED ORDER — METOPROLOL SUCCINATE ER 50 MG PO TB24
50.0000 mg | ORAL_TABLET | Freq: Every day | ORAL | Status: DC
  Administered 2023-04-15 – 2023-04-16 (×2): 50 mg via ORAL
  Filled 2023-04-15 (×3): qty 1

## 2023-04-15 NOTE — Progress Notes (Addendum)
PHARMACY - ANTICOAGULATION CONSULT NOTE  Pharmacy Consult for heparin Indication: chest pain/ACS  Allergies  Allergen Reactions   Lisinopril Swelling    Lips swelling    Patient Measurements: Weight: 86 kg (189 lb 9.5 oz) Heparin Dosing Weight: n/a. Use total body weight  Vital Signs: Temp: 99 F (37.2 C) (11/06 1545) Temp Source: Oral (11/06 1545) BP: 146/93 (11/06 1545) Pulse Rate: 94 (11/06 1545)  Labs: Recent Labs    04/15/23 1530  HGB 15.5  HCT 44.5  PLT 217  CREATININE 1.38*  TROPONINIHS 1,474*    CrCl cannot be calculated (Unknown ideal weight.).   Medical History: Past Medical History:  Diagnosis Date   Angina pectoris (HCC)    Caregiver stress 03/19/2012   Chronic pain of right knee 05/11/2018   Hypertension    HYPERTENSION, BENIGN 09/14/2008   Qualifier: Diagnosis of  By: Excell Seltzer, MD, [place]    Insomnia 05/11/2018   Situational mixed anxiety and depressive disorder 03/19/2012   Secondary to Caregiver stress and impending death of mother     Medications: No fills for anticoagulants PTA  Assessment: Patient is a 39 yoM presenting with chest pain, SOB, body aches x3 days. Pt reports use of narcotics and heroin.   Troponin elevated. Pharmacy consulted to dose heparin for ACS.   Today, 04/15/23 CBC: WNL Troponin: 1,474 SCr 1.38, CrCl ~60 mL/min  Goal of Therapy:  Heparin level 0.3-0.7 units/ml Monitor platelets by anticoagulation protocol: Yes   Plan:  Heparin bolus of 4000 units IV once Heparin infusion of 1100 units/hr Check 8 hour heparin level CBC, heparin level daily Monitor for signs of bleeding  Cindi Carbon, PharmD 04/15/2023,5:32 PM

## 2023-04-15 NOTE — ED Provider Notes (Signed)
[place] EMERGENCY DEPARTMENT AT [facility] Provider Note   CSN: [id number] Arrival date & time: 04/15/23  1230     History  Chief Complaint  Patient presents with   Withdrawal    Stephen Walker is a 72 y.o. male.  72 year old male with prior medical history as detailed below presents for evaluation.  Patient reports that he has been using heroin and narcotics recreationally for quite some time.  He reports that he typically snorts these medications and heroin.  He decided on his birthday that he should try to become sober and clean.  He reports that he has not used any heroin or illicit narcotics for the last 4 days.  He presents to the ED complaining of nausea, shaking/tremors, weakness.  Also complains of some intermittent diffuse anterior chest pain.  At the time of my evaluation he denies chest pain.  He reports nausea.  He reports feeling weak and shaky.  He denies associated alcohol consumption.  He denies prior history of cardiac disease.  He reports history of hypertension and hyperlipidemia.  The history is provided by the patient and medical records.       Home Medications Prior to Admission medications   Medication Sig Start Date End Date Taking? Authorizing Provider  amLODipine (NORVASC) 2.5 MG tablet Take 1 tablet (2.5 mg total) by mouth daily as needed (For systolic blood pressure greater than 130 (top number)). 03/08/20   Nahser, Deloris Ping, MD  hydrochlorothiazide (HYDRODIURIL) 25 MG tablet TAKE 1 TABLET BY MOUTH  DAILY 06/21/19   Georgina Quint, MD  hydrOXYzine (ATARAX/VISTARIL) 25 MG tablet Take 2 tablets (50 mg total) by mouth at bedtime as needed. 06/07/19   Georgina Quint, MD  metoprolol succinate (TOPROL-XL) 100 MG 24 hr tablet Take 1 tablet (100 mg total) by mouth daily. Take with or immediately following a meal. Please make overdue appt with Dr. Shari Prows before anymore refills. Thank you 1st attempt 05/06/21   Meriam Sprague, MD  rosuvastatin (CRESTOR) 10 MG tablet Take 1 tablet (10 mg total) by mouth daily. Please make overdue appt with Dr. Shari Prows before anymore refills. Thank you 1st attempt 05/06/21   Meriam Sprague, MD  traZODone (DESYREL) 50 MG tablet TAKE 1 TABLET BY MOUTH AT  BEDTIME 06/21/19   Georgina Quint, MD      Allergies    Lisinopril    Review of Systems   Review of Systems  All other systems reviewed and are negative.   Physical Exam Updated Vital Signs BP (!) 146/93 (BP Location: Left Arm)   Pulse 94   Temp 99 F (37.2 C) (Oral)   Resp (!) 24   Wt 86 kg   SpO2 100%   BMI 25.01 kg/m  Physical Exam Vitals and nursing note reviewed.  Constitutional:      General: He is not in acute distress.    Appearance: He is well-developed.     Comments: Alert, appears uncomfortable, complains of nausea, appears shaky  HENT:     Head: Normocephalic and atraumatic.  Eyes:     Conjunctiva/sclera: Conjunctivae normal.     Pupils: Pupils are equal, round, and reactive to light.  Cardiovascular:     Rate and Rhythm: Normal rate and regular rhythm.     Heart sounds: Normal heart sounds.  Pulmonary:     Effort: Pulmonary effort is normal. No respiratory distress.     Breath sounds: Normal breath sounds.  Abdominal:  General: There is no distension.     Palpations: Abdomen is soft.     Tenderness: There is no abdominal tenderness.  Musculoskeletal:        General: No deformity. Normal range of motion.     Cervical back: Normal range of motion and neck supple.  Skin:    General: Skin is warm and dry.  Neurological:     General: No focal deficit present.     Mental Status: He is alert and oriented to person, place, and time.     ED Results / Procedures / Treatments   Labs (all labs ordered are listed, but only abnormal results are displayed) Labs Reviewed  CBC - Abnormal; Notable for the following components:      Result Value   WBC 15.0 (*)    All other  components within normal limits  COMPREHENSIVE METABOLIC PANEL - Abnormal; Notable for the following components:   Sodium 134 (*)    Chloride 95 (*)    CO2 21 (*)    Glucose, Bld 163 (*)    Creatinine, Ser 1.38 (*)    Total Protein 9.3 (*)    Total Bilirubin 1.2 (*)    GFR, Estimated 55 (*)    Anion gap 18 (*)    All other components within normal limits  TROPONIN I (HIGH SENSITIVITY) - Abnormal; Notable for the following components:   Troponin I (High Sensitivity) 1,474 (*)    All other components within normal limits  LIPASE, BLOOD  TROPONIN I (HIGH SENSITIVITY)    EKG EKG Interpretation Date/Time:  Wednesday April 15 2023 12:39:13 EST Ventricular Rate:  105 PR Interval:  135 QRS Duration:  87 QT Interval:  340 QTC Calculation: 450 R Axis:   56  Text Interpretation: Sinus tachycardia Probable left atrial enlargement Borderline repolarization abnormality Confirmed by Kristine Royal [phone]) on 04/15/2023 3:05:52 PM  Radiology DG Chest 2 View  Result Date: 04/15/2023 CLINICAL DATA:  Chest pain.  Shortness of breath. EXAM: CHEST - 2 VIEW COMPARISON:  None Available. FINDINGS: The heart size and mediastinal contours are within normal limits. Elevation of the left hemidiaphragm. No focal consolidation, pneumothorax, or pleural effusion. No acute osseous abnormality. IMPRESSION: Elevation of the left hemidiaphragm. Otherwise, no acute cardiopulmonary findings. Electronically Signed   By: Hart Robinsons M.D.   On: 04/15/2023 16:05    Procedures Procedures    Medications Ordered in ED Medications  sodium chloride 0.9 % bolus 1,000 mL (1,000 mLs Intravenous New Bag/Given 04/15/23 1558)  ondansetron (ZOFRAN) injection 4 mg (4 mg Intravenous Given 04/15/23 1557)    ED Course/ Medical Decision Making/ A&P                                 Medical Decision Making Amount and/or Complexity of Data Reviewed Labs: ordered. Radiology: ordered.  Risk OTC drugs. Prescription  drug management. Decision regarding hospitalization.    Medical Screen Complete  This patient presented to the ED with complaint of narcotic withdrawal.  This complaint involves an extensive number of treatment options. The initial differential diagnosis includes, but is not limited to, narcotic withdrawal, ACS, metabolic abnormality, AKI, etc.  This presentation is: Acute, Self-Limited, Previously Undiagnosed, Uncertain Prognosis, Complicated, Systemic Symptoms, and Threat to Life/Bodily Function  Patient is presenting with complaints of nausea, vomiting, weakness, feeling shaky.  Patient admits to significant daily consumption of heroin and narcotics.  He apparently decided to quit 4  days ago and reports symptom onset then.  Notable abnormalities on workup include elevated troponin.  Patient reported intermittent chest discomfort over the last several days.  EKG is without evidence of acute ischemia.  Patient without active chest pain.  Case discussed with Dr. Rosemary Holms, Cardiology, who agrees with plan for aspirin and heparin per protocol.  Patient will require admission for trending of troponin and cardiology workup.  Hospitalist service made aware of case and will evaluate for admission.  Additional history obtained:  External records from outside sources obtained and reviewed including prior ED visits and prior Inpatient records.    Lab Tests:  I ordered and personally interpreted labs.  The pertinent results include:  cbc cmp lipase trop   Imaging Studies ordered:  I ordered imaging studies including cxr  I independently visualized and interpreted obtained imaging which showed nad I agree with the radiologist interpretation.   Cardiac Monitoring:  The patient was maintained on a cardiac monitor.  I personally viewed and interpreted the cardiac monitor which showed an underlying rhythm of: nsr   Medicines ordered:  I ordered medication including ivf, zofran,  heparin  for nausea, elevated trop  Reevaluation of the patient after these medicines showed that the patient: improved   Problem List / ED Course:  Narcotic withdrawal, elevated troponin   Reevaluation:  After the interventions noted above, I reevaluated the patient and found that they have: improved   Disposition:  After consideration of the diagnostic results and the patients response to treatment, I feel that the patent would benefit from admission.          Final Clinical Impression(s) / ED Diagnoses Final diagnoses:  Narcotic withdrawal (HCC)  Elevated troponin    Rx / DC Orders ED Discharge Orders     None         Wynetta Fines, MD 04/15/23 2220

## 2023-04-15 NOTE — H&P (Signed)
History and Physical  Stephen Walker VHQ:[id number] DOB: [birth year] DOA: 04/15/2023  PCP: [facility]   Chief Complaint: Shakiness, nausea  HPI: Stephen Walker is a 72 y.o. male with medical history significant for benign hypertension, narcotic abuse being admitted to the hospital with NSTEMI.  Patient states that he is a prior heroin user but more recently due to several life stressors he has started snorting heroin and narcotics.  However due to his recent birthday, he decided about 4 days ago to stop cold [place].  Since that time, he has been feeling nauseous, jittery, palpitations.  Today he started having some chest pain, this happened when he got up to ambulate at home, felt a sharp stabbing pain in the left side of his chest which radiated to his back.  ED Course: In the emergency department he has been tachycardic and mildly hypertensive.  Saturating well on room air.  EKG and chest x-ray unremarkable.  Lab work significant for WBC 15, creatinine 1.38 (unknown baseline), normal LFTs and lipase.  Troponin 1474.  ER provider discussed with cardiology on-call, who recommended initiation of IV heparin per ACS protocol.  Currently the patient feels jittery and anxious, feels vaguely nauseous.  Denies chest pain.  Review of Systems: Please see HPI for pertinent positives and negatives. A complete 10 system review of systems are otherwise negative.  Past Medical History:  Diagnosis Date   Angina pectoris (HCC)    Caregiver stress 03/19/2012   Chronic pain of right knee 05/11/2018   Hypertension    HYPERTENSION, BENIGN 09/14/2008   Qualifier: Diagnosis of  By: Excell Seltzer, MD, [place]    Insomnia 05/11/2018   Situational mixed anxiety and depressive disorder 03/19/2012   Secondary to Caregiver stress and impending death of mother    History reviewed. No pertinent surgical history.  Social History:  reports that he has been smoking cigarettes. He has a 25 pack-year smoking  history. He has never used smokeless tobacco. He reports that he does not drink alcohol and does not use drugs.   Allergies  Allergen Reactions   Lisinopril Swelling    Lips swelling    Family History  Problem Relation Age of Onset   Heart disease Mother        Atrial Fibrillation     Prior to Admission medications   Medication Sig Start Date End Date Taking? Authorizing Provider  amLODipine (NORVASC) 2.5 MG tablet Take 1 tablet (2.5 mg total) by mouth daily as needed (For systolic blood pressure greater than 130 (top number)). 03/08/20   Nahser, Deloris Ping, MD  hydrochlorothiazide (HYDRODIURIL) 25 MG tablet TAKE 1 TABLET BY MOUTH  DAILY 06/21/19   Georgina Quint, MD  hydrOXYzine (ATARAX/VISTARIL) 25 MG tablet Take 2 tablets (50 mg total) by mouth at bedtime as needed. 06/07/19   Georgina Quint, MD  metoprolol succinate (TOPROL-XL) 100 MG 24 hr tablet Take 1 tablet (100 mg total) by mouth daily. Take with or immediately following a meal. Please make overdue appt with Dr. Shari Prows before anymore refills. Thank you 1st attempt 05/06/21   Meriam Sprague, MD  rosuvastatin (CRESTOR) 10 MG tablet Take 1 tablet (10 mg total) by mouth daily. Please make overdue appt with Dr. Shari Prows before anymore refills. Thank you 1st attempt 05/06/21   Meriam Sprague, MD  traZODone (DESYREL) 50 MG tablet TAKE 1 TABLET BY MOUTH AT  BEDTIME 06/21/19   Georgina Quint, MD    Physical Exam: BP (!) 146/93 (  BP Location: Left Arm)   Pulse 94   Temp 99 F (37.2 C) (Oral)   Resp (!) 24   Wt 86 kg   SpO2 100%   BMI 25.01 kg/m   General:  Alert, oriented, anxious, in no acute distress  Eyes: EOMI, clear conjuctivae, white sclerea Neck: supple, no masses, trachea mildline  Cardiovascular: Tachycardic and regular, chest is nontender, no murmurs or rubs, no peripheral edema  Respiratory: clear to auscultation bilaterally, no wheezes, no crackles  Abdomen: soft, nontender,  nondistended, normal bowel tones heard  Skin: dry, no rashes  Musculoskeletal: no joint effusions, normal range of motion  Psychiatric: appropriate affect, normal speech  Neurologic: extraocular muscles intact, clear speech, moving all extremities with intact sensorium         Labs on Admission:  Basic Metabolic Panel: Recent Labs  Lab 04/15/23 1530  NA 134*  K 3.6  CL 95*  CO2 21*  GLUCOSE 163*  BUN 21  CREATININE 1.38*  CALCIUM 10.2   Liver Function Tests: Recent Labs  Lab 04/15/23 1530  AST 38  ALT 26  ALKPHOS 70  BILITOT 1.2*  PROT 9.3*  ALBUMIN 4.1   Recent Labs  Lab 04/15/23 1530  LIPASE 39   No results for input(s): "AMMONIA" in the last 168 hours. CBC: Recent Labs  Lab 04/15/23 1530  WBC 15.0*  HGB 15.5  HCT 44.5  MCV 91.0  PLT 217   Cardiac Enzymes: No results for input(s): "CKTOTAL", "CKMB", "CKMBINDEX", "TROPONINI" in the last 168 hours.  BNP (last 3 results) No results for input(s): "BNP" in the last 8760 hours.  ProBNP (last 3 results) No results for input(s): "PROBNP" in the last 8760 hours.  CBG: No results for input(s): "GLUCAP" in the last 168 hours.  Radiological Exams on Admission: DG Chest 2 View  Result Date: 04/15/2023 CLINICAL DATA:  Chest pain.  Shortness of breath. EXAM: CHEST - 2 VIEW COMPARISON:  None Available. FINDINGS: The heart size and mediastinal contours are within normal limits. Elevation of the left hemidiaphragm. No focal consolidation, pneumothorax, or pleural effusion. No acute osseous abnormality. IMPRESSION: Elevation of the left hemidiaphragm. Otherwise, no acute cardiopulmonary findings. Electronically Signed   By: Hart Robinsons M.D.   On: 04/15/2023 16:05    Assessment/Plan This is a pleasant 72 year old gentleman with a history of hypertension, hyperlipidemia, narcotic abuse being admitted to the hospital with evidence of narcotic withdrawal and NSTEMI.  NSTEMI-patient presented with sharp left-sided  chest pain radiating to his back.  He remains tachycardic, but free of chest pain here in the emergency department. -Observation admission to cardiac telemetry at [facility] -Check stat CTA chest to rule out aortic dissection, or PE though not highly suspected -Given aspirin in the emergency department, resume home Toprol-XL -ER provider discussed with on-call cardiology at [facility], they will follow along -Trend troponin -Will keep n.p.o. after midnight  Left-sided chest pain-with ambulation, currently resolved.  Management as above.  Renal insufficiency-unknown chronicity, with unknown baseline renal function -Avoid nephrotoxins, hold hydrochlorothiazide -Trend renal function with daily labs  Leukocytosis-with no evidence of acute bacterial infection, likely reactive due to his narcotic withdrawal  Hypertension-continue home Toprol-XL, likely exacerbated by his narcotic withdrawal, with IV hydralazine as needed in the meantime  Narcotic withdrawal-discontinued narcotic abuse about 4 days ago -P.o. Ativan as needed for agitation/anxiety -IV Zofran as needed for nausea  Hyperlipidemia-Crestor 5 mg p.o. daily    Code Status: Full Code  Consults called: None  Admission  status: Observation to cardiac telemetry at [facility]  Time spent: 59 minutes  Malvern Kadlec Sharlette Dense MD [facility] Pager [phone]  If 7PM-7AM, please contact night-coverage [email or web address] Password [facility]  04/15/2023, 5:57 PM

## 2023-04-15 NOTE — Progress Notes (Signed)
Stephen Walker is a 72 y.o. male with medical history significant for benign hypertension, narcotic abuse being initially admitted to the hospital with NSTEMI.   Writer was informed by ER doc that patient CTA PE study came back positive for pulmonary embolism.  CTA read "Large saddle embolus. Positive for acute PE with CT evidence of right heart strain (RV/LV Ratio = 2.5) consistent with at least submassive (intermediate risk) PE" I evaluated patient at the bedside.  Patient found to be in moderate narcotic withdrawal but no respiratory distress. HR 100s-120s, SBP 140s-160s, SpO2 94-97% on room air.  Discussed CT findings with patient who informed me that he has been sedentary for the last 4 days due to "feeling bad" from narcotic withdrawal.  Discussed my plan to check an echocardiogram and move him to progressive bed. Will also give one-time dose of IV Ativan as his p.o. dose is not yet due.  Troponinemia likely secondary to pulmonary embolism. -Transfer patient to progressive bed for close monitoring -Echocardiogram -IV Ativan 1 mg x 1 -Continue IV heparin  Steffanie Rainwater, MD 04/15/2023, 8:54 PM [facility] [place] 41:10

## 2023-04-15 NOTE — ED Triage Notes (Signed)
BIBA from PCP c/o tremors, n/v, body aches, anterior wall chest pain, and sob x3 days.  Pt reports normally a daily user of heroin and oxycodone but has been trying to stop. Last usage for both was 3 days ago.

## 2023-04-16 ENCOUNTER — Other Ambulatory Visit: Payer: Self-pay

## 2023-04-16 ENCOUNTER — Observation Stay (HOSPITAL_COMMUNITY): Payer: Medicare HMO

## 2023-04-16 DIAGNOSIS — I2692 Saddle embolus of pulmonary artery without acute cor pulmonale: Secondary | ICD-10-CM | POA: Diagnosis present

## 2023-04-16 DIAGNOSIS — F1193 Opioid use, unspecified with withdrawal: Secondary | ICD-10-CM | POA: Diagnosis present

## 2023-04-16 DIAGNOSIS — I129 Hypertensive chronic kidney disease with stage 1 through stage 4 chronic kidney disease, or unspecified chronic kidney disease: Secondary | ICD-10-CM | POA: Diagnosis present

## 2023-04-16 DIAGNOSIS — D72829 Elevated white blood cell count, unspecified: Secondary | ICD-10-CM | POA: Diagnosis present

## 2023-04-16 DIAGNOSIS — I214 Non-ST elevation (NSTEMI) myocardial infarction: Secondary | ICD-10-CM | POA: Diagnosis present

## 2023-04-16 DIAGNOSIS — G4700 Insomnia, unspecified: Secondary | ICD-10-CM | POA: Diagnosis present

## 2023-04-16 DIAGNOSIS — F1123 Opioid dependence with withdrawal: Secondary | ICD-10-CM | POA: Diagnosis present

## 2023-04-16 DIAGNOSIS — E785 Hyperlipidemia, unspecified: Secondary | ICD-10-CM | POA: Diagnosis present

## 2023-04-16 DIAGNOSIS — E739 Lactose intolerance, unspecified: Secondary | ICD-10-CM | POA: Diagnosis present

## 2023-04-16 DIAGNOSIS — F17210 Nicotine dependence, cigarettes, uncomplicated: Secondary | ICD-10-CM | POA: Diagnosis present

## 2023-04-16 DIAGNOSIS — Z7901 Long term (current) use of anticoagulants: Secondary | ICD-10-CM | POA: Diagnosis not present

## 2023-04-16 DIAGNOSIS — I2602 Saddle embolus of pulmonary artery with acute cor pulmonale: Secondary | ICD-10-CM

## 2023-04-16 DIAGNOSIS — I071 Rheumatic tricuspid insufficiency: Secondary | ICD-10-CM | POA: Diagnosis present

## 2023-04-16 DIAGNOSIS — I471 Supraventricular tachycardia, unspecified: Secondary | ICD-10-CM | POA: Diagnosis present

## 2023-04-16 DIAGNOSIS — Z853 Personal history of malignant neoplasm of breast: Secondary | ICD-10-CM | POA: Diagnosis not present

## 2023-04-16 DIAGNOSIS — N1832 Chronic kidney disease, stage 3b: Secondary | ICD-10-CM | POA: Diagnosis present

## 2023-04-16 DIAGNOSIS — M7989 Other specified soft tissue disorders: Secondary | ICD-10-CM

## 2023-04-16 DIAGNOSIS — I252 Old myocardial infarction: Secondary | ICD-10-CM | POA: Diagnosis not present

## 2023-04-16 DIAGNOSIS — Z8249 Family history of ischemic heart disease and other diseases of the circulatory system: Secondary | ICD-10-CM | POA: Diagnosis not present

## 2023-04-16 DIAGNOSIS — E8770 Fluid overload, unspecified: Secondary | ICD-10-CM | POA: Diagnosis present

## 2023-04-16 DIAGNOSIS — Z634 Disappearance and death of family member: Secondary | ICD-10-CM | POA: Diagnosis not present

## 2023-04-16 DIAGNOSIS — Z79899 Other long term (current) drug therapy: Secondary | ICD-10-CM | POA: Diagnosis not present

## 2023-04-16 DIAGNOSIS — E876 Hypokalemia: Secondary | ICD-10-CM | POA: Diagnosis not present

## 2023-04-16 DIAGNOSIS — J9600 Acute respiratory failure, unspecified whether with hypoxia or hypercapnia: Secondary | ICD-10-CM | POA: Diagnosis present

## 2023-04-16 DIAGNOSIS — Z888 Allergy status to other drugs, medicaments and biological substances status: Secondary | ICD-10-CM | POA: Diagnosis not present

## 2023-04-16 LAB — CBC
HCT: 40.8 % (ref 39.0–52.0)
Hemoglobin: 14 g/dL (ref 13.0–17.0)
MCH: 31 pg (ref 26.0–34.0)
MCHC: 34.3 g/dL (ref 30.0–36.0)
MCV: 90.5 fL (ref 80.0–100.0)
Platelets: 231 10*3/uL (ref 150–400)
RBC: 4.51 MIL/uL (ref 4.22–5.81)
RDW: 13.9 % (ref 11.5–15.5)
WBC: 18.8 10*3/uL — ABNORMAL HIGH (ref 4.0–10.5)
nRBC: 0 % (ref 0.0–0.2)

## 2023-04-16 LAB — BASIC METABOLIC PANEL
Anion gap: 16 — ABNORMAL HIGH (ref 5–15)
BUN: 27 mg/dL — ABNORMAL HIGH (ref 8–23)
CO2: 22 mmol/L (ref 22–32)
Calcium: 9.4 mg/dL (ref 8.9–10.3)
Chloride: 97 mmol/L — ABNORMAL LOW (ref 98–111)
Creatinine, Ser: 1.51 mg/dL — ABNORMAL HIGH (ref 0.61–1.24)
GFR, Estimated: 49 mL/min — ABNORMAL LOW (ref >60)
Glucose, Bld: 139 mg/dL — ABNORMAL HIGH (ref 70–99)
Potassium: 3.1 mmol/L — ABNORMAL LOW (ref 3.5–5.1)
Sodium: 135 mmol/L (ref 135–145)

## 2023-04-16 LAB — ECHOCARDIOGRAM COMPLETE
Area-P 1/2: 4.06 cm2
Calc EF: 66 %
Height: 73 in
S' Lateral: 2.6 cm
Single Plane A2C EF: 70.7 %
Single Plane A4C EF: 59.2 %
Weight: 2758.4 [oz_av]

## 2023-04-16 LAB — MAGNESIUM: Magnesium: 1.8 mg/dL (ref 1.7–2.4)

## 2023-04-16 LAB — HEPARIN LEVEL (UNFRACTIONATED): Heparin Unfractionated: 0.37 [IU]/mL (ref 0.30–0.70)

## 2023-04-16 MED ORDER — BUPRENORPHINE HCL-NALOXONE HCL 2-0.5 MG SL SUBL
1.0000 | SUBLINGUAL_TABLET | SUBLINGUAL | Status: AC | PRN
  Administered 2023-04-16: 1 via SUBLINGUAL
  Filled 2023-04-16: qty 1

## 2023-04-16 MED ORDER — POTASSIUM CHLORIDE CRYS ER 20 MEQ PO TBCR
40.0000 meq | EXTENDED_RELEASE_TABLET | Freq: Every day | ORAL | Status: DC
Start: 2023-04-16 — End: 2023-04-19
  Administered 2023-04-16 – 2023-04-19 (×4): 40 meq via ORAL
  Filled 2023-04-16 (×4): qty 2

## 2023-04-16 MED ORDER — DIAZEPAM 5 MG PO TABS
5.0000 mg | ORAL_TABLET | Freq: Four times a day (QID) | ORAL | Status: DC | PRN
  Administered 2023-04-16 – 2023-04-17 (×4): 5 mg via ORAL
  Filled 2023-04-16 (×4): qty 1

## 2023-04-16 MED ORDER — BUPRENORPHINE HCL-NALOXONE HCL 8-2 MG SL SUBL
1.0000 | SUBLINGUAL_TABLET | Freq: Two times a day (BID) | SUBLINGUAL | Status: DC
  Administered 2023-04-17 – 2023-04-19 (×5): 1 via SUBLINGUAL
  Filled 2023-04-16 (×6): qty 1

## 2023-04-16 MED ORDER — SODIUM CHLORIDE 0.9 % IV SOLN
INTRAVENOUS | Status: DC
Start: 2023-04-16 — End: 2023-04-17

## 2023-04-16 MED ORDER — HYDROXYZINE HCL 25 MG PO TABS
25.0000 mg | ORAL_TABLET | Freq: Four times a day (QID) | ORAL | Status: DC | PRN
  Administered 2023-04-16 – 2023-04-17 (×3): 25 mg via ORAL
  Filled 2023-04-16 (×3): qty 1

## 2023-04-16 MED ORDER — ENOXAPARIN SODIUM 80 MG/0.8ML IJ SOSY
80.0000 mg | PREFILLED_SYRINGE | Freq: Two times a day (BID) | INTRAMUSCULAR | Status: DC
  Administered 2023-04-16 – 2023-04-17 (×2): 80 mg via SUBCUTANEOUS
  Filled 2023-04-16 (×2): qty 0.8

## 2023-04-16 NOTE — Progress Notes (Signed)
    Patient Name: Stephen Walker           DOB: 11-18-1950  MRN: [id number]      Admission Date: 04/15/2023  Attending Provider: Maryln Gottron, MD  Primary Diagnosis: NSTEMI (non-ST elevated myocardial infarction) [facility])   Level of care: Progressive    CROSS COVER NOTE   Date of Service   04/16/2023   Stephen Walker, 72 y.o. male, was admitted on 04/15/2023 for NSTEMI (non-ST elevated myocardial infarction) (HCC).    HPI/Events of Note   Opiate Withdrawal  Patient reports snorting heroin, consistently for the past 2 years.  Last dose of heroin was taken 4 days ago.  He states he would like to "get clean." Patient endorses feeling anxious, restless, nauseous, having tremors and achy joints, and feeling SOB. Nursing staff to provide PRNs to only alleviate symptoms.   Leukocytosis Febrile Patient is currently tachycardic, tachypneic on 2 L nasal cannula.  He is diaphoretic and has spiked fever 101.7.  WBC 15.0.  Leukocytosis could be reactive due to opiate withdrawal. Denies chills, cough, sputum production, abdominal pain, diarrhea, dysuria, urgency/frequency, or retention.  Chest x-ray negative for cardiopulmonary disease.  UA pending.  BC pending. Hold off on antibiotics, trend fever and WBC.   Bedside evaluation- Patient is awake, A/O x4, with no associated distress.   Respiratory: Clear to auscultation bilaterally, no wheezing, no crackles.  Tachypneic.  No accessory muscle use.  Cardiovascular: Regular rate and rhythm. No extremity edema. 2+ pedal pulses.  Abdomen: Abdomen is soft and nontender.  Positive bowel sounds noted in all quadrants.      Addendum  0145-patient endorses poor relief with Ativan.  Will trial Valium.  Interventions/ Plan   Urine drug screen COWS, every 8 hrs PRNs for agitation/anxiety, nausea, itching.  Blood cultures Hold off on antibiotics, trend fever and WBC.        Stephen Harada, DNP, ACNPC- AG [facility]

## 2023-04-16 NOTE — Progress Notes (Signed)
   04/15/23 2156  Assess: MEWS Score  Temp (!) 101.7 F (38.7 C)  BP (!) 157/95  MAP (mmHg) 113  Pulse Rate (!) 108  Resp (!) 29  SpO2 97 %  O2 Device Nasal Cannula  O2 Flow Rate (L/min) 2 L/min  Assess: MEWS Score  MEWS Temp 2  MEWS Systolic 0  MEWS Pulse 1  MEWS RR 2  MEWS LOC 0  MEWS Score 5  MEWS Score Color Red  Assess: if the MEWS score is Yellow or Red  Were vital signs accurate and taken at a resting state? Yes  Does the patient meet 2 or more of the SIRS criteria? Yes  Does the patient have a confirmed or suspected source of infection? No  MEWS guidelines implemented  No, previously red, continue vital signs every 4 hours  Assess: SIRS CRITERIA  SIRS Temperature  1  SIRS Pulse 1  SIRS Respirations  1  SIRS WBC 0  SIRS Score Sum  3   Provider notified of VS and COW score of 12. New orders for PRN meds received and administered.  Provider at bedside.

## 2023-04-16 NOTE — Progress Notes (Signed)
PHARMACY - ANTICOAGULATION CONSULT NOTE  Pharmacy Consult for heparin Indication: chest pain/ACS and pulmonary embolus  Allergies  Allergen Reactions   Lisinopril Swelling and Other (See Comments)    Lips swell   Crestor [Rosuvastatin] Other (See Comments)    Made the patient "not feel well"   Lactose Diarrhea   Metformin And Related Other (See Comments)    Made the patient "not feel well"   Pravastatin Other (See Comments)    Made the patient "not feel well"    Patient Measurements: Weight: 86 kg (189 lb 9.5 oz) Heparin Dosing Weight: n/a. Use total body weight  Vital Signs: Temp: 98.3 F (36.8 C) (11/07 0608) Temp Source: Oral (11/07 0608) BP: 165/108 (11/07 0608) Pulse Rate: 69 (11/07 0608)  Labs: Recent Labs    04/15/23 1530 04/15/23 1735 04/16/23 0457  HGB 15.5  --  14.0  HCT 44.5  --  40.8  PLT 217  --  231  HEPARINUNFRC  --   --  0.37  CREATININE 1.38*  --  1.51*  TROPONINIHS 1,474* 1,367*  --     CrCl cannot be calculated (Unknown ideal weight.).   Medical History: Past Medical History:  Diagnosis Date   Angina pectoris (HCC)    Caregiver stress 03/19/2012   Chronic pain of right knee 05/11/2018   Hypertension    HYPERTENSION, BENIGN 09/14/2008   Qualifier: Diagnosis of  By: Excell Seltzer, MD, [place]    Insomnia 05/11/2018   Situational mixed anxiety and depressive disorder 03/19/2012   Secondary to Caregiver stress and impending death of mother     Medications: No fills for anticoagulants PTA  Assessment: Patient is a 32 yoM presenting with chest pain, SOB, body aches x3 days. Pt reports use of narcotics and heroin. Troponin elevated. Pharmacy consulted to dose heparin for ACS. CTA PE showing large saddle embolus with right heart strain - consistent with submassive PE.   Today, 04/16/23 Heparin level 0.37 - therapeutic on heparin @1100  units/hr Hgb 14, plts 231--stable SCr 1.51, CrCl ~55 mL/min No s/sx of bleeding reported  Goal of Therapy:   Heparin level 0.3-0.7 units/ml Monitor platelets by anticoagulation protocol: Yes   Plan:  Continue heparin infusion @1100  units/hr Check 8 hour confirmatory heparin level CBC, heparin level daily Monitor for signs of bleeding   Cherylin Mylar, PharmD Clinical Pharmacist  11/7/20246:21 AM

## 2023-04-16 NOTE — Progress Notes (Signed)
BLE venous duplex has been completed.  Preliminary findings given to Dr. Mahala Menghini.   Results can be found under chart review under CV PROC. 04/16/2023 3:04 PM Stephen Walker RVT, RDMS

## 2023-04-16 NOTE — TOC Initial Note (Signed)
Transition of Care [facility]) - Initial/Assessment Note    Patient Details  Name: Stephen Walker MRN: [id number] Date of Birth: [birth year]  Transition [facility]) CM/SW Contact:    Howell Rucks, RN Phone Number: 04/16/2023, 11:42 AM  Clinical Narrative: Met with pt at bedside to introduce role of TOC/NCM and review for dc planning. Pt reports he has an established PCP and pharmacy, no current home care services or home DME, pt reports he feels safe returning home with support from his spouse, confirmed transporation available at discharge. TOC consult for Substance Abuse resources and Safe injection, needle exchange programs, signs and symptoms of overdose resources, pt declined. TOC will continue to follow.               Expected Discharge Plan: Home/Self Care Barriers to Discharge: Continued Medical Work up   Patient Goals and CMS Choice Patient states their goals for this hospitalization and ongoing recovery are:: return home with support from spouse          Expected Discharge Plan and Services       Living arrangements for the past 2 months: Single Family Home                                      Prior Living Arrangements/Services Living arrangements for the past 2 months: Single Family Home Lives with:: Spouse Patient language and need for interpreter reviewed:: Yes Do you feel safe going back to the place where you live?: Yes      Need for Family Participation in Patient Care: Yes (Comment) Care giver support system in place?: Yes (comment)   Criminal Activity/Legal Involvement Pertinent to Current Situation/Hospitalization: No - Comment as needed  Activities of Daily Living   ADL Screening (condition at time of admission) Independently performs ADLs?: Yes (appropriate for developmental age) Is the patient deaf or have difficulty hearing?: No Does the patient have difficulty seeing, even when wearing glasses/contacts?: No Does the patient have  difficulty concentrating, remembering, or making decisions?: No  Permission Sought/Granted                  Emotional Assessment Appearance:: Appears stated age Attitude/Demeanor/Rapport: Gracious Affect (typically observed): Accepting Orientation: : Oriented to Self, Oriented to Place, Oriented to  Time, Oriented to Situation Alcohol / Substance Use: Illicit Drugs Psych Involvement: No (comment)  Admission diagnosis:  Narcotic withdrawal (HCC) [F11.93] Elevated troponin [R79.89] NSTEMI (non-ST elevated myocardial infarction) [facility]) [I21.4] Patient Active Problem List   Diagnosis Date Noted   NSTEMI (non-ST elevated myocardial infarction) (HCC) 04/15/2023   Essential hypertension 05/11/2018   Insomnia 05/11/2018   Chronic pain of right knee 05/11/2018   Caregiver stress 03/19/2012   Situational mixed anxiety and depressive disorder 03/19/2012   HYPERTENSION, BENIGN 09/14/2008   PCP:  [facility]:   [id number] [place] Otto, [place] - [address] AT [facility] OF [facility] & [address] [place] [address] Phone: [phone] Fax: [phone]     Social Determinants of Health (SDOH) Social History: SDOH Screenings   Food Insecurity: No Food Insecurity (04/15/2023)  Housing: Low Risk  (04/15/2023)  Transportation Needs: No Transportation Needs (04/15/2023)  Utilities: Not At Risk (04/15/2023)  Depression (PHQ2-9): Low Risk  (09/15/2018)  Tobacco Use: High Risk (04/15/2023)   SDOH Interventions:     Readmission Risk Interventions  No data to display

## 2023-04-16 NOTE — Plan of Care (Signed)
  Problem: Clinical Measurements: Goal: Respiratory complications will improve Outcome: Progressing Goal: Cardiovascular complication will be avoided Outcome: Progressing   Problem: Pain Management: Goal: General experience of comfort will improve Outcome: Progressing   Problem: Safety: Goal: Ability to remain free from injury will improve Outcome: Progressing

## 2023-04-16 NOTE — Progress Notes (Signed)
   04/15/23 2110  Assess: MEWS Score  Temp 98 F (36.7 C)  BP (!) 140/100  MAP (mmHg) 112  Pulse Rate (!) 117  Resp (!) 28  SpO2 91 %  O2 Device Room Air  Assess: MEWS Score  MEWS Temp 0  MEWS Systolic 0  MEWS Pulse 2  MEWS RR 2  MEWS LOC 0  MEWS Score 4  MEWS Score Color Red  Assess: if the MEWS score is Yellow or Red  Were vital signs accurate and taken at a resting state? Yes  Does the patient meet 2 or more of the SIRS criteria? Yes  Does the patient have a confirmed or suspected source of infection? No  MEWS guidelines implemented  Yes, red  Treat  MEWS Interventions Considered administering scheduled or prn medications/treatments as ordered  Take Vital Signs  Increase Vital Sign Frequency  Red: Q1hr x2, continue Q4hrs until patient remains green for 12hrs  Escalate  MEWS: Escalate Red: Discuss with charge nurse and notify provider. Consider notifying RRT. If remains red for 2 hours consider need for higher level of care  Notify: Charge Nurse/RN  Name of Charge Nurse/RN Notified Hilda RN  Provider Notification  Provider Name/Title Virgel Manifold NP  Date Provider Notified 04/15/23  Time Provider Notified 2148  Method of Notification  (secure chat)  Notification Reason Other (Comment) (red MEWS)  Provider response See new orders;En route  Date of Provider Response 04/16/23  Time of Provider Response 2227  Assess: SIRS CRITERIA  SIRS Temperature  0  SIRS Pulse 1  SIRS Respirations  1  SIRS WBC 0  SIRS Score Sum  2

## 2023-04-16 NOTE — Progress Notes (Signed)
PHARMACY - ANTICOAGULATION CONSULT NOTE  Pharmacy Consult for Lovenox Indication:  NSTEMI, PE, DVT  Allergies  Allergen Reactions   Lisinopril Swelling and Other (See Comments)    Lips swell   Crestor [Rosuvastatin] Other (See Comments)    Made the patient "not feel well"   Lactose Diarrhea   Metformin And Related Other (See Comments)    Made the patient "not feel well"   Pravastatin Other (See Comments)    Made the patient "not feel well"    Patient Measurements: Height: 6\' 1"  (185.4 cm) Weight: 78.2 kg (172 lb 6.4 oz) IBW/kg (Calculated) : 79.9   Vital Signs: Temp: 99 F (37.2 C) (11/07 1532) Temp Source: Oral (11/07 1532) BP: 174/95 (11/07 1532) Pulse Rate: 97 (11/07 1532)  Labs: Recent Labs    04/15/23 1530 04/15/23 1735 04/16/23 0457  HGB 15.5  --  14.0  HCT 44.5  --  40.8  PLT 217  --  231  HEPARINUNFRC  --   --  0.37  CREATININE 1.38*  --  1.51*  TROPONINIHS 1,474* 1,367*  --     Estimated Creatinine Clearance: 50.3 mL/min (A) (by C-G formula based on SCr of 1.51 mg/dL (H)).   Medical History: Past Medical History:  Diagnosis Date   Angina pectoris (HCC)    Caregiver stress 03/19/2012   Chronic pain of right knee 05/11/2018   Hypertension    HYPERTENSION, BENIGN 09/14/2008   Qualifier: Diagnosis of  By: Excell Seltzer, MD, [place]    Insomnia 05/11/2018   Situational mixed anxiety and depressive disorder 03/19/2012   Secondary to Caregiver stress and impending death of mother     Assessment:  AC/Heme: NSTEMI + CTAngio = + large saddle embolus acute PE with evidence of right heart strain + BL DVTs - CBC WNL - 11/7 PM: Cannot get blood draws despite 4 attempts. Ok to Change to Lovenox  Goal of Therapy:  Anti-Xa level 0.6-1 units/ml 4hrs after LMWH dose given Monitor platelets by anticoagulation protocol: Yes   Plan:  D/c IV heparin. Cannot get levels Lovenox 80mg  SQ q12 hrs. CBC q72h while on LMWH   Stephen Walker S. Merilynn Finland, PharmD,  BCPS Clinical Staff Pharmacist [email or web address] Merilynn Finland, Alister Staver Stillinger 04/16/2023,3:45 PM

## 2023-04-16 NOTE — Progress Notes (Signed)
  Echocardiogram 2D Echocardiogram has been performed.  Janalyn Harder 04/16/2023, 10:04 AM

## 2023-04-16 NOTE — Progress Notes (Signed)
HOSPITALIST ROUNDING NOTE Stephen Walker ZOX:[id number]  DOB: 1950/10/05  DOA: 04/15/2023  PCP: [name]  04/16/2023,8:03 AM   LOS: 0 days      Code Status: Full From: Home  current Dispo: Likely home     72 year old male Known chronic heroin habituation since the 70s still using quit 4 days ago --disliked methadone-went to [facility] and was placed on a "pill" for abstinence but never returned for follow-up Atypical chest pain in the past in 2010 HTN on medications Insomnia Current tobacco use half pack per day Allergy to lisinopril cannot tolerate ACE ARB--- was supposed to follow-up with cardiology and their recommendations in 2021 cataract cardiac CT and cancelled several appointments  Presented to emergency room 04/15/2023 with crushing chest pain--recent 3-hour travel to sister-in-law's place still chronic smoker--also apparently was trying to quit heroin and was sedentary at home  Found to have massive saddle embolus on CT chest with troponins  and 1500 range  BUN/creatinine 21/1.3  white count 15  11/7-cardiology consulted-echo pending   Plan  Large saddle pulmonary embolism with at least submassive intermediate risk PE Continue heparin gtt. echo pending-transition to DOAC after 48 hours Risk factors-recent travel, sedentary state, smoker Follow duplex ordered  Elevated troponin Per cardiology-risk factor stratification probably as an outpatient given smoking history-if further chest pain however will need to be evaluated in the hospital Echo is pending Aspirin 325 given, started this admission Toprol-XL 50 Crestor 5 Would hold HCTZ --add back amlodipine 2.5  Heroin habituation Start Suboxone after discussion-have advised him I can prescribe it in hospital but he will need follow-up in the outpatient setting for this--- I have alerted both him and his wife to start looking for someone who can prescribe this as he will need follow-up to maintain sobriety He  will be placed on the detox protocol for opioid withdrawal with Bentyl hydroxyzine Robaxin and Naprosyn for now  CKD 3 at baseline Probably mediated by altered hemodynamics Would hold HCTZ and check labs in the morning Continue saline 40 cc/H  Mild hypokalemia Give K-Dur 40 check magnesium in a.m.  DVT prophylaxis: IV heparin  Status is: Observation The patient remains OBS appropriate and will d/c before 2 midnights.    Subjective: Having some pain in his chest but better Seems to be worse with cough and movement although it is now just 4/10  Objective + exam Vitals:   04/15/23 2350 04/16/23 0000 04/16/23 0211 04/16/23 0608  BP:   (!) 158/102 (!) 165/108  Pulse:   (!) 108 69  Resp:  (!) 28 (!) 25 (!) 22  Temp: (!) 100.8 F (38.2 C)  100.3 F (37.9 C) 98.3 F (36.8 C)  TempSrc: Oral  Oral Oral  SpO2:   100% 98%  Weight:      Height:       Filed Weights   04/15/23 1239 04/15/23 2110  Weight: 86 kg 78.2 kg    Examination:  Awake coherent pleasant no wheeze rales rhonchi ROM intact CTAB no wheeze rales Or lower extremity edema  Data Reviewed: reviewed   CBC    Component Value Date/Time   WBC 18.8 (H) 04/16/2023 0457   RBC 4.51 04/16/2023 0457   HGB 14.0 04/16/2023 0457   HCT 40.8 04/16/2023 0457   PLT 231 04/16/2023 0457   MCV 90.5 04/16/2023 0457   MCH 31.0 04/16/2023 0457   MCHC 34.3 04/16/2023 0457   RDW 13.9 04/16/2023 0457      Latest Ref  Rng & Units 04/16/2023    4:57 AM 04/15/2023    3:30 PM  CMP  Glucose 70 - 99 mg/dL 098  119   BUN 8 - 23 mg/dL 27  21   Creatinine 1.47 - 1.24 mg/dL 8.29  5.62   Sodium 130 - 145 mmol/L 135  134   Potassium 3.5 - 5.1 mmol/L 3.1  3.6   Chloride 98 - 111 mmol/L 97  95   CO2 22 - 32 mmol/L 22  21   Calcium 8.9 - 10.3 mg/dL 9.4  86.5   Total Protein 6.5 - 8.1 g/dL  9.3   Total Bilirubin <1.2 mg/dL  1.2   Alkaline Phos 38 - 126 U/L  70   AST 15 - 41 U/L  38   ALT 0 - 44 U/L  26      Scheduled Meds:   metoprolol succinate  50 mg Oral Daily   rosuvastatin  5 mg Oral Daily   Continuous Infusions:  heparin 1,100 Units/hr (04/15/23 1848)    Time  46  Rhetta Mura, MD  [facility]

## 2023-04-16 NOTE — Consult Note (Signed)
NAME:  Stephen Walker, MRN:  [id number], DOB:  1950-10-08, LOS: 0 ADMISSION DATE:  04/15/2023, CONSULTATION DATE:  11/7 REFERRING MD:  Mahala Menghini, CHIEF COMPLAINT:  Pulmonary emboli   History of Present Illness:  72 year old male w/ hx as outlined below. Presented to ER acutely on 11/6 w/ cc: crushing chest pain. Had recent 3.5 hr car travel to sister-in-law's place. Apparently not feeling well for about 4 days prior. He attributed this to trying to stop heroin. No report of pre-syncope or shortness of breath.   In ER: room air sats in 90s, tachycardic w/ HR 100-120,  trop mildly elevated 1474. Initially treated as possible STEMI and narcotic wd.  As part of eval a CT angio of chest was completed. This identified a large saddle pulmonary emboli w RV/LV ratio 2.5. He had already been placed on IV heparin. Admitted to the progressive unit.   Additional data:  11/7 ECHO: LVEF 65-70% no LWMA, mild LVH, There was interventricular septum flattening during diastole c/w RV volume overload. The RV fxn was moderately reduced, RV size severely enlarged, RA was dilated. Estimated PAS 43.49mmHg 11/7 LEUS: bilateral LE acute DVT  Given CT findings and ECHO results all c/w RV dysfxn Pulm/critical care asked to see if felt would benefit from catheter directed therapy (in this case thrombectomy) vs systemic lytics vs anticoagulation   At time of consult  HR 97,  RR18, bp 174/95 room air sats:  Last trop obtained yesterday evening and was trending down   Pertinent  Medical History  Active tobacco abuse, Chronic heroin use since 70s, HTN, Insomnia  Significant Hospital Events: Including procedures, antibiotic start and stop dates in addition to other pertinent events     Interim History / Subjective:  Still having some chest discomfort  Objective   Blood pressure (!) 174/95, pulse 97, temperature 99 F (37.2 C), temperature source Oral, resp. rate 18, height 6\' 1"  (1.854 m), weight 78.2 kg, SpO2 (!)  84%.        Intake/Output Summary (Last 24 hours) at 04/16/2023 1615 Last data filed at 04/16/2023 1109 Gross per 24 hour  Intake 520.12 ml  Output 400 ml  Net 120.12 ml   Filed Weights   04/15/23 1239 04/15/23 2110  Weight: 86 kg 78.2 kg    Examination: General: 72 year old male resting in bed. No distress HENT: NCAT no JVD  Lungs: clear  Cardiovascular: RRR Abdomen: soft  Extremities: + LE edema brisk CR  Neuro: awake alert no focal def  GU: voids  Resolved Hospital Problem list     Assessment & Plan:   Acute submassive Pulmonary emboli w/ right heart strain  Bilateral acute DVT Elevated troponin CKD stage IIIb Heroin abuse  Tobacco abuse Hypertension    Acute submassive Pulmonary emboli w/ right heart strain  Bilateral acute DVT.  Seemingly provoked by sedentary life style and recent travel  Also active smoker -presenting PESI score 110= high risk class IV -Current PESI =90 class III intermediate  Plan/rec Cont systemic AC, if no issues after 48 hrs can transition to oral DOAC assuming renal fxn will allow for at least 3 mo.  Would continue to monitor in the progressive care setting Cont pulse ox   Best Practice (right click and "Reselect all SmartList Selections" daily)   Per primary  Labs   CBC: Recent Labs  Lab 04/15/23 1530 04/16/23 0457  WBC 15.0* 18.8*  HGB 15.5 14.0  HCT 44.5 40.8  MCV 91.0 90.5  PLT 217 231    Basic Metabolic Panel: Recent Labs  Lab 04/15/23 1530 04/16/23 0457  NA 134* 135  K 3.6 3.1*  CL 95* 97*  CO2 21* 22  GLUCOSE 163* 139*  BUN 21 27*  CREATININE 1.38* 1.51*  CALCIUM 10.2 9.4  MG  --  1.8   GFR: Estimated Creatinine Clearance: 50.3 mL/min (A) (by C-G formula based on SCr of 1.51 mg/dL (H)). Recent Labs  Lab 04/15/23 1530 04/16/23 0457  WBC 15.0* 18.8*    Liver Function Tests: Recent Labs  Lab 04/15/23 1530  AST 38  ALT 26  ALKPHOS 70  BILITOT 1.2*  PROT 9.3*  ALBUMIN 4.1   Recent Labs   Lab 04/15/23 1530  LIPASE 39   No results for input(s): "AMMONIA" in the last 168 hours.  ABG No results found for: "PHART", "PCO2ART", "PO2ART", "HCO3", "TCO2", "ACIDBASEDEF", "O2SAT"   Coagulation Profile: No results for input(s): "INR", "PROTIME" in the last 168 hours.  Cardiac Enzymes: No results for input(s): "CKTOTAL", "CKMB", "CKMBINDEX", "TROPONINI" in the last 168 hours.  HbA1C: No results found for: "HGBA1C"  CBG: No results for input(s): "GLUCAP" in the last 168 hours.  Review of Systems:   Review of Systems  Constitutional: Negative.   HENT: Negative.    Eyes: Negative.   Respiratory:  Positive for shortness of breath.   Cardiovascular:  Positive for chest pain.  Gastrointestinal: Negative.   Genitourinary: Negative.   Musculoskeletal: Negative.   Skin: Negative.   Endo/Heme/Allergies: Negative.      Past Medical History:  He,  has a past medical history of Angina pectoris (HCC), Caregiver stress (03/19/2012), Chronic pain of right knee (05/11/2018), Hypertension, HYPERTENSION, BENIGN (09/14/2008), Insomnia (05/11/2018), and Situational mixed anxiety and depressive disorder (03/19/2012).   Surgical History:  History reviewed. No pertinent surgical history.   Social History:   reports that he has been smoking cigarettes. He has a 25 pack-year smoking history. He has never used smokeless tobacco. He reports that he does not drink alcohol and does not use drugs.   Family History:  His family history includes Heart disease in his mother.   Allergies Allergies  Allergen Reactions   Lisinopril Swelling and Other (See Comments)    Lips swell   Crestor [Rosuvastatin] Other (See Comments)    Made the patient "not feel well"   Lactose Diarrhea   Metformin And Related Other (See Comments)    Made the patient "not feel well"   Pravastatin Other (See Comments)    Made the patient "not feel well"     Home Medications  Prior to Admission medications    Medication Sig Start Date End Date Taking? Authorizing Provider  hydrochlorothiazide (HYDRODIURIL) 12.5 MG tablet Take 12.5 mg by mouth daily.   Yes [provider]  metoprolol tartrate (LOPRESSOR) 50 MG tablet Take 50 mg by mouth in the morning.   Yes [provider]  traZODone (DESYREL) 50 MG tablet TAKE 1 TABLET BY MOUTH AT  BEDTIME Patient taking differently: Take 50 mg by mouth at bedtime as needed for sleep. 06/21/19  Yes Sagardia, Eilleen Kempf, MD  amLODipine (NORVASC) 2.5 MG tablet Take 1 tablet (2.5 mg total) by mouth daily as needed (For systolic blood pressure greater than 130 (top number)). Patient not taking: Reported on 04/15/2023 03/08/20   Nahser, Deloris Ping, MD  hydrochlorothiazide (HYDRODIURIL) 25 MG tablet TAKE 1 TABLET BY MOUTH  DAILY Patient not taking: Reported on 04/15/2023 06/21/19   Georgina Quint,  MD  hydrOXYzine (ATARAX/VISTARIL) 25 MG tablet Take 2 tablets (50 mg total) by mouth at bedtime as needed. Patient not taking: Reported on 04/15/2023 06/07/19   Georgina Quint, MD  metoprolol succinate (TOPROL-XL) 100 MG 24 hr tablet Take 1 tablet (100 mg total) by mouth daily. Take with or immediately following a meal. Please make overdue appt with Dr. Shari Prows before anymore refills. Thank you 1st attempt Patient not taking: Reported on 04/15/2023 05/06/21   Meriam Sprague, MD  rosuvastatin (CRESTOR) 10 MG tablet Take 1 tablet (10 mg total) by mouth daily. Please make overdue appt with Dr. Shari Prows before anymore refills. Thank you 1st attempt Patient not taking: Reported on 04/15/2023 05/06/21   Meriam Sprague, MD     Critical care time: NA

## 2023-04-16 NOTE — Consult Note (Addendum)
Cardiology Consultation   Patient ID: Ancelmo Hunt MRN: [id number]; DOB: 1950-08-27  Admit date: 04/15/2023 Date of Consult: 04/16/2023  PCP:  [facility]   [facility] Providers Cardiologist:  None   Patient Profile:   Kentrell Hallahan is a 72 y.o. male with a hx of hypertension, heroin use, nicotine dependence, CKD who is being seen 04/16/2023 for the evaluation of NSTEMI at the request of Dr. Mahala Menghini.  History of Present Illness:   Mr. Stangelo has no prior cardiac history.  In 2021 he was seen briefly on 1 occasion by Dr. Shari Prows for chest pain that had resolved with metoprolol and with no recurrences.  She had plans for coronary CT, does not look like this was completed.  He does have a mother who has prior stents.  Not a known diabetic.  He does smoke about half a pack a day.  He is a known heroin user.  He states that previously he had done heroin but once he joined the [employer] in 1975.  Quit for several years.  He reports restarting a couple years ago or so due to multiple life stressors including losing his job, his wife having breast cancer, and the recent death of his son a couple months ago.  Currently patient admitted for chest pain and significant shortness of breath.  A CTA was obtained that shows a large saddle PE with right heart strain.  He also quit heroin 4 days ago cold [place] and has been having withdrawal symptoms was noted to be nauseous, jittery, diaphoretic yesterday.  Cardiology has been asked to evaluate due to elevated troponins 1474-1367.  He is on supplemental oxygen 2 to 4 L and been started on IV heparin.  Prior to his admission he denied any cardiac symptoms such as chest pain, shortness of breath, peripheral edema, dizziness, orthopnea, PND.  Wife is accompanied with him today and reports that he has a very active individual with little limitations.  He did report sharp left-sided chest pain and another episode of sharp back pain that  has since resolved and has not had any prior or recurrent episodes of this.  Had no radiation with this pain.  He is on supplemental oxygen in which he feels has made him feel much comfortable.  We discussed his heroin use and he states that he is wanting to quit and knows that he can as he has done so before.  He is not interested in methadone, but stated that he would like to get assistance with Suboxone.  Also did not feel he needed counseling.  Negative chest x-ray.  Large saddle PE as above.  RV/LV ratio 2.5, indicating right heart strain.  Potassium 3.1, creatinine 1.5.  Leukocytosis 18.8.  Positive for THC.  Echocardiogram pending.  He is tachycardic with heart rates in the 110s.  Also reported to be febrile yesterday.   Past Medical History:  Diagnosis Date   Angina pectoris (HCC)    Caregiver stress 03/19/2012   Chronic pain of right knee 05/11/2018   Hypertension    HYPERTENSION, BENIGN 09/14/2008   Qualifier: Diagnosis of  By: Excell Seltzer, MD, [place]    Insomnia 05/11/2018   Situational mixed anxiety and depressive disorder 03/19/2012   Secondary to Caregiver stress and impending death of mother     History reviewed. No pertinent surgical history.   Inpatient Medications: Scheduled Meds:  metoprolol succinate  50 mg Oral Daily   potassium chloride  40 mEq Oral Daily  rosuvastatin  5 mg Oral Daily   Continuous Infusions:  sodium chloride     heparin 1,100 Units/hr (04/15/23 1848)   PRN Meds: acetaminophen **OR** acetaminophen, albuterol, diazepam, dicyclomine, diphenhydrAMINE, hydrOXYzine, methocarbamol, naproxen, ondansetron **OR** ondansetron (ZOFRAN) IV, traZODone  Allergies:    Allergies  Allergen Reactions   Lisinopril Swelling and Other (See Comments)    Lips swell   Crestor [Rosuvastatin] Other (See Comments)    Made the patient "not feel well"   Lactose Diarrhea   Metformin And Related Other (See Comments)    Made the patient "not feel well"   Pravastatin  Other (See Comments)    Made the patient "not feel well"    Social History:   Social History   Socioeconomic History   Marital status: Married    Spouse name: Not on file   Number of children: Not on file   Years of education: Not on file   Highest education level: Not on file  Occupational History   Occupation: retired  Tobacco Use   Smoking status: Every Day    Current packs/day: 0.50    Average packs/day: 0.5 packs/day for 50.0 years (25.0 ttl pk-yrs)    Types: Cigarettes   Smokeless tobacco: Never  Substance and Sexual Activity   Alcohol use: Never   Drug use: Never   Sexual activity: Not Currently    Partners: Female  Other Topics Concern   Not on file  Social History Narrative   ** Merged History Encounter **       Social Determinants of Health   Financial Resource Strain: Not on file  Food Insecurity: No Food Insecurity (04/15/2023)   Hunger Vital Sign    Worried About Running Out of Food in the Last Year: Never true    Ran Out of Food in the Last Year: Never true  Transportation Needs: No Transportation Needs (04/15/2023)   PRAPARE - Administrator, Civil Service (Medical): No    Lack of Transportation (Non-Medical): No  Physical Activity: Not on file  Stress: Not on file  Social Connections: Not on file  Intimate Partner Violence: Not At Risk (04/15/2023)   Humiliation, Afraid, Rape, and Kick questionnaire    Fear of Current or Ex-Partner: No    Emotionally Abused: No    Physically Abused: No    Sexually Abused: No    Family History:    Family History  Problem Relation Age of Onset   Heart disease Mother        Atrial Fibrillation     ROS:  Please see the history of present illness.  All other ROS reviewed and negative.     Physical Exam/Data:   Vitals:   04/15/23 2350 04/16/23 0000 04/16/23 0211 04/16/23 0608  BP:   (!) 158/102 (!) 165/108  Pulse:   (!) 108 69  Resp:  (!) 28 (!) 25 (!) 22  Temp: (!) 100.8 F (38.2 C)  100.3 F  (37.9 C) 98.3 F (36.8 C)  TempSrc: Oral  Oral Oral  SpO2:   100% 98%  Weight:      Height:        Intake/Output Summary (Last 24 hours) at 04/16/2023 0917 Last data filed at 04/16/2023 0700 Gross per 24 hour  Intake 160.12 ml  Output 400 ml  Net -239.88 ml      04/15/2023    9:10 PM 04/15/2023   12:39 PM 03/08/2020    9:17 AM  Last 3 Weights  Weight (lbs)  172 lb 6.4 oz 189 lb 9.5 oz 190 lb 6.4 oz  Weight (kg) 78.2 kg 86 kg 86.365 kg     Body mass index is 22.75 kg/m.  General:  Well nourished, well developed, in no acute distress.  On supplemental oxygen 2 to 4 L via nasal cannula HEENT: normal Neck: no JVD Vascular: No carotid bruits; Distal pulses 2+ bilaterally Cardiac:  normal S1, S2; RRR; no murmur, tachycardic Lungs:  clear to auscultation bilaterally, no wheezing, rhonchi or rales  Abd: soft, nontender, no hepatomegaly  Ext: no edema Musculoskeletal:  No deformities, BUE and BLE strength normal and equal Skin: warm and dry  Neuro:  CNs 2-12 intact, no focal abnormalities noted Psych:  Normal affect   EKG:  The EKG was personally reviewed and demonstrates: Sinus tachycardia, heart rate 120.  Diffuse nonspecific T wave changes. Telemetry:  Telemetry was personally reviewed and demonstrates: Sinus tachycardia heart rates around 110.    Relevant CV Studies: Echocardiogram 04/16/2023 Pending  Laboratory Data:  High Sensitivity Troponin:   Recent Labs  Lab 04/15/23 1530 04/15/23 1735  TROPONINIHS 1,474* 1,367*     Chemistry Recent Labs  Lab 04/15/23 1530 04/16/23 0457  NA 134* 135  K 3.6 3.1*  CL 95* 97*  CO2 21* 22  GLUCOSE 163* 139*  BUN 21 27*  CREATININE 1.38* 1.51*  CALCIUM 10.2 9.4  MG  --  1.8  GFRNONAA 55* 49*  ANIONGAP 18* 16*    Recent Labs  Lab 04/15/23 1530  PROT 9.3*  ALBUMIN 4.1  AST 38  ALT 26  ALKPHOS 70  BILITOT 1.2*   Lipids No results for input(s): "CHOL", "TRIG", "HDL", "LABVLDL", "LDLCALC", "CHOLHDL" in the last 168  hours.  Hematology Recent Labs  Lab 04/15/23 1530 04/16/23 0457  WBC 15.0* 18.8*  RBC 4.89 4.51  HGB 15.5 14.0  HCT 44.5 40.8  MCV 91.0 90.5  MCH 31.7 31.0  MCHC 34.8 34.3  RDW 14.0 13.9  PLT 217 231   Thyroid No results for input(s): "TSH", "FREET4" in the last 168 hours.  BNPNo results for input(s): "BNP", "PROBNP" in the last 168 hours.  DDimer No results for input(s): "DDIMER" in the last 168 hours.   Radiology/Studies:  CT Angio Chest Pulmonary Embolism (PE) W or WO Contrast  Result Date: 04/15/2023 CLINICAL DATA:  Chest pain radiating to the back. Concern for aortic dissection or pulmonary embolism. EXAM: CT ANGIOGRAPHY CHEST WITH CONTRAST TECHNIQUE: Multidetector CT imaging of the chest was performed using the standard protocol during bolus administration of intravenous contrast. Multiplanar CT image reconstructions and MIPs were obtained to evaluate the vascular anatomy. RADIATION DOSE REDUCTION: This exam was performed according to the departmental dose-optimization program which includes automated exposure control, adjustment of the mA and/or kV according to patient size and/or use of iterative reconstruction technique. CONTRAST:  80mL OMNIPAQUE IOHEXOL 350 MG/ML SOLN COMPARISON:  Chest radiograph dated 04/15/2023. FINDINGS: Cardiovascular: There is mild dilatation of the right heart chambers with RV/LV ratio of 2.5 consistent with right heart straining. Retrograde flow of contrast from the right atrium into the IVC suggestive of right heart dysfunction. The thoracic aorta is unremarkable. There is a large saddle embolus straddling the bifurcation of the pulmonary trunk and extending from the central pulmonary arteries bilaterally. Mediastinum/Nodes: No hilar or mediastinal adenopathy. The esophagus is grossly unremarkable. No mediastinal fluid collection. Lungs/Pleura: Background of severe emphysema. No focal consolidation, pleural effusion, or pneumothorax. The central airways are  patent. Upper Abdomen: No acute  abnormality. Musculoskeletal: No acute osseous pathology. Review of the MIP images confirms the above findings. IMPRESSION: Large saddle embolus. Positive for acute PE with CT evidence of right heart strain (RV/LV Ratio = 2.5) consistent with at least submassive (intermediate risk) PE. The presence of right heart strain has been associated with an increased risk of morbidity and mortality. Please refer to the "Code PE Focused" order set in EPIC. These results were called by telephone at the time of interpretation on 04/15/2023 at 7:08 pm to Dr. Rodena Medin, who verbally acknowledged these results. Electronically Signed   By: Elgie Collard M.D.   On: 04/15/2023 19:19   DG Chest 2 View  Result Date: 04/15/2023 CLINICAL DATA:  Chest pain.  Shortness of breath. EXAM: CHEST - 2 VIEW COMPARISON:  None Available. FINDINGS: The heart size and mediastinal contours are within normal limits. Elevation of the left hemidiaphragm. No focal consolidation, pneumothorax, or pleural effusion. No acute osseous abnormality. IMPRESSION: Elevation of the left hemidiaphragm. Otherwise, no acute cardiopulmonary findings. Electronically Signed   By: Hart Robinsons M.D.   On: 04/15/2023 16:05     Assessment and Plan:   NSTEMI Acute PE Admitted for acute large saddle PE with evidence of right heart strain and heroin withdrawal.  Troponins 1610-9604.  EKG showing diffuse nonspecific T wave abnormalities. He has had no prior chest pain, had 1 episode of left sharp chest pain here with no recurrence.  Suspect he may have some degree of CAD, however given isolated episode of chest pain in the setting of PE with no prior cardiac complaints, suspect that his presentation is secondary to his PE rather than ACS.  Additionally, with recent heroin use and withdrawal concern of compliancy with DAPT is also significant. Would medically treat for now, can reevaluate again once he can maintain sobriety and PE has  been treated. Continue IV heparin per primary team, does not appear to be any plans for intervention or lytics. Continue Toprol XL 50mg , statin Check echocardiogram, had evidence of right heart strain on CTA.  Also with heroin use he might have some cardiomyopathy from that.  Heroin use with withdrawal Nicotine dependence half a pack per day Strongly encourage cessation.  He is willing to try Suboxone but not interested in methadone or counseling.  He is adamant that he can quit on his own as he has done so previously.  Relates his use to significant stressors including breast cancer, recent death of his son, job loss.  Hyperlipidemia Continue rosuvastatin 5 mg  Hypertension Likely acutely elevated secondary to above.  Would continue to treat PE and reassess need for antihypertensive medication.   Risk Assessment/Risk Scores:   TIMI Risk Score for Unstable Angina or Non-ST Elevation MI:   The patient's TIMI risk score is 3, which indicates a 13% risk of all cause mortality, new or recurrent myocardial infarction or need for urgent revascularization in the next 14 days.{  For questions or updates, please contact [facility] Please consult [email or web address] for contact info under    Signed, Abagail Kitchens, PA-C  04/16/2023 9:17 AM

## 2023-04-17 ENCOUNTER — Other Ambulatory Visit: Payer: Self-pay

## 2023-04-17 ENCOUNTER — Other Ambulatory Visit (HOSPITAL_COMMUNITY): Payer: Self-pay

## 2023-04-17 DIAGNOSIS — I214 Non-ST elevation (NSTEMI) myocardial infarction: Secondary | ICD-10-CM | POA: Diagnosis not present

## 2023-04-17 LAB — COMPREHENSIVE METABOLIC PANEL
ALT: 22 U/L (ref 0–44)
AST: 24 U/L (ref 15–41)
Albumin: 3.3 g/dL — ABNORMAL LOW (ref 3.5–5.0)
Alkaline Phosphatase: 52 U/L (ref 38–126)
Anion gap: 12 (ref 5–15)
BUN: 22 mg/dL (ref 8–23)
CO2: 22 mmol/L (ref 22–32)
Calcium: 8.9 mg/dL (ref 8.9–10.3)
Chloride: 104 mmol/L (ref 98–111)
Creatinine, Ser: 1.3 mg/dL — ABNORMAL HIGH (ref 0.61–1.24)
GFR, Estimated: 59 mL/min — ABNORMAL LOW (ref >60)
Glucose, Bld: 118 mg/dL — ABNORMAL HIGH (ref 70–99)
Potassium: 3.5 mmol/L (ref 3.5–5.1)
Sodium: 138 mmol/L (ref 135–145)
Total Bilirubin: 0.7 mg/dL (ref <1.2)
Total Protein: 7.7 g/dL (ref 6.5–8.1)

## 2023-04-17 LAB — CBC
HCT: 37.1 % — ABNORMAL LOW (ref 39.0–52.0)
Hemoglobin: 12.6 g/dL — ABNORMAL LOW (ref 13.0–17.0)
MCH: 31.7 pg (ref 26.0–34.0)
MCHC: 34 g/dL (ref 30.0–36.0)
MCV: 93.2 fL (ref 80.0–100.0)
Platelets: 194 10*3/uL (ref 150–400)
RBC: 3.98 MIL/uL — ABNORMAL LOW (ref 4.22–5.81)
RDW: 14.2 % (ref 11.5–15.5)
WBC: 14.6 10*3/uL — ABNORMAL HIGH (ref 4.0–10.5)
nRBC: 0 % (ref 0.0–0.2)

## 2023-04-17 LAB — MAGNESIUM: Magnesium: 1.8 mg/dL (ref 1.7–2.4)

## 2023-04-17 MED ORDER — METOPROLOL SUCCINATE ER 100 MG PO TB24
100.0000 mg | ORAL_TABLET | Freq: Every day | ORAL | Status: DC
  Administered 2023-04-17 – 2023-04-18 (×2): 100 mg via ORAL
  Filled 2023-04-17 (×2): qty 1

## 2023-04-17 MED ORDER — TRAZODONE HCL 50 MG PO TABS
50.0000 mg | ORAL_TABLET | Freq: Every evening | ORAL | Status: DC | PRN
  Administered 2023-04-17: 50 mg via ORAL

## 2023-04-17 MED ORDER — DIAZEPAM 5 MG PO TABS
5.0000 mg | ORAL_TABLET | Freq: Four times a day (QID) | ORAL | 0 refills | Status: DC | PRN
  Filled 2023-04-17: qty 12, 4d supply, fill #0

## 2023-04-17 MED ORDER — APIXABAN 5 MG PO TABS: 5.0000 mg | ORAL_TABLET | Freq: Two times a day (BID) | ORAL | Status: DC

## 2023-04-17 MED ORDER — AMLODIPINE BESYLATE 5 MG PO TABS: 2.5000 mg | ORAL_TABLET | Freq: Every day | ORAL | Status: DC | PRN

## 2023-04-17 MED ORDER — APIXABAN 5 MG PO TABS
10.0000 mg | ORAL_TABLET | Freq: Two times a day (BID) | ORAL | Status: DC
  Administered 2023-04-17 – 2023-04-19 (×4): 10 mg via ORAL
  Filled 2023-04-17 (×4): qty 2

## 2023-04-17 MED ORDER — APIXABAN (ELIQUIS) VTE STARTER PACK (10MG AND 5MG)
ORAL_TABLET | ORAL | 0 refills | Status: DC
  Filled 2023-04-17: qty 74, 30d supply, fill #0

## 2023-04-17 MED ORDER — BUPRENORPHINE HCL-NALOXONE HCL 8-2 MG SL SUBL
1.0000 | SUBLINGUAL_TABLET | Freq: Two times a day (BID) | SUBLINGUAL | 0 refills | Status: DC
  Filled 2023-04-17: qty 30, 15d supply, fill #0

## 2023-04-17 MED ORDER — ROSUVASTATIN CALCIUM 10 MG PO TABS: 10.0000 mg | ORAL_TABLET | Freq: Every day | ORAL | Status: DC

## 2023-04-17 MED ORDER — METOPROLOL TARTRATE 25 MG PO TABS
25.0000 mg | ORAL_TABLET | Freq: Once | ORAL | Status: AC
  Administered 2023-04-17: 25 mg via ORAL
  Filled 2023-04-17: qty 1

## 2023-04-17 MED ORDER — TAMSULOSIN HCL 0.4 MG PO CAPS
0.4000 mg | ORAL_CAPSULE | Freq: Every day | ORAL | Status: DC
  Administered 2023-04-17 – 2023-04-19 (×3): 0.4 mg via ORAL
  Filled 2023-04-17 (×3): qty 1

## 2023-04-17 MED ORDER — TRAZODONE HCL 50 MG PO TABS
50.0000 mg | ORAL_TABLET | Freq: Every day | ORAL | Status: DC
  Administered 2023-04-17 – 2023-04-18 (×2): 50 mg via ORAL
  Filled 2023-04-17 (×2): qty 1

## 2023-04-17 MED ORDER — AMLODIPINE BESYLATE 5 MG PO TABS
2.5000 mg | ORAL_TABLET | Freq: Every day | ORAL | Status: DC
  Administered 2023-04-17 – 2023-04-19 (×3): 2.5 mg via ORAL
  Filled 2023-04-17 (×3): qty 1

## 2023-04-17 NOTE — Progress Notes (Signed)
PHARMACY - ANTICOAGULATION CONSULT NOTE  Pharmacy Consult for apixaban Indication: pulmonary embolus and DVT  Allergies  Allergen Reactions   Lisinopril Swelling and Other (See Comments)    Lips swell   Crestor [Rosuvastatin] Other (See Comments)    Made the patient "not feel well"   Lactose Diarrhea   Metformin And Related Other (See Comments)    Made the patient "not feel well"   Pravastatin Other (See Comments)    Made the patient "not feel well"    Patient Measurements: Height: 6\' 1"  (185.4 cm) Weight: 78.2 kg (172 lb 6.4 oz) IBW/kg (Calculated) : 79.9 Heparin Dosing Weight: 78.2 kg  Vital Signs: Temp: 99.7 F (37.6 C) (11/08 0549) Temp Source: Oral (11/08 0549) BP: 164/102 (11/08 0549) Pulse Rate: 90 (11/08 0549)  Labs: Recent Labs    04/15/23 1530 04/15/23 1735 04/16/23 0457 04/17/23 0530  HGB 15.5  --  14.0 12.6*  HCT 44.5  --  40.8 37.1*  PLT 217  --  231 194  HEPARINUNFRC  --   --  0.37  --   CREATININE 1.38*  --  1.51* 1.30*  TROPONINIHS 1,474* 1,367*  --   --     Estimated Creatinine Clearance: 58.5 mL/min (A) (by C-G formula based on SCr of 1.3 mg/dL (H)).   Medical History: Past Medical History:  Diagnosis Date   Angina pectoris (HCC)    Caregiver stress 03/19/2012   Chronic pain of right knee 05/11/2018   Hypertension    HYPERTENSION, BENIGN 09/14/2008   Qualifier: Diagnosis of  By: Excell Seltzer, MD, [place]    Insomnia 05/11/2018   Situational mixed anxiety and depressive disorder 03/19/2012   Secondary to Caregiver stress and impending death of mother      Assessment: Pt is a 72 yo male who presented to the Ed on 11/06 with tremors, n/v, body aches, chest pain, and SOB x 3d. PMH includes HTN and NSTEMI. Pt reports daily heroin and oxycodone use, stopped cold [place] 3 days before admission. CT positive for PE on 11/6, doppler on 11/7 positive for bilateral DVTs. Patient was started on Lovenox 80 mg BID for treatment. Pharmacy has been  consulted to transition to DOAC.   04/17/2023 - Hgb trending down, 12.6  - Plts trending down, 194  - No bleeding documented   Goal of Therapy:  Monitor platelets by anticoagulation protocol: Yes   Plan:  - Stop Lovenox injections  - Start apixaban 10 mg BID x 7d, then 5 mg BID thereafter  - Monitor for s/sx of bleeding  - Pharmacy to educate  - Patient with Medicare, plan to use [facility] OP RX for Eliquis starter pack  Karlton Lemon, PharmD Candidate  04/17/2023,11:51 AM

## 2023-04-17 NOTE — TOC Benefit Eligibility Note (Signed)
Patient Product/process development scientist completed.    The patient is insured through [employer]. Patient has Medicare and is not eligible for a copay card, but may be able to apply for patient assistance, if available.    Ran test claim for Eliquis 5 mg and the current 30 day co-pay is $47.00.  Ran test claim for Xarelto 20 mg and the current 30 day co-pay is $47.00.  This test claim was processed through [facility]- copay amounts may vary at other pharmacies due to pharmacy/plan contracts, or as the patient moves through the different stages of their insurance plan.     Roland Earl, CPHT Pharmacy Technician III Certified Patient Advocate [facility] Patient Advocate Team Direct Number: [phone]  Fax: [phone]

## 2023-04-17 NOTE — Discharge Instructions (Signed)
Information on my medicine - ELIQUIS (apixaban)  This medication education was reviewed with me or my healthcare representative as part of my discharge preparation.  The pharmacist that spoke with me during my hospital stay was:   Why was Eliquis prescribed for you? Eliquis was prescribed to treat blood clots that may have been found in the veins of your legs (deep vein thrombosis) or in your lungs (pulmonary embolism) and to reduce the risk of them occurring again.  What do You need to know about Eliquis ? The starting dose is 10 mg (two 5 mg tablets) taken TWICE daily for the FIRST SEVEN (7) DAYS, then on 11/15  the dose is reduced to ONE 5 mg tablet taken TWICE daily.  Eliquis may be taken with or without food.   Try to take the dose about the same time in the morning and in the evening. If you have difficulty swallowing the tablet whole please discuss with your pharmacist how to take the medication safely.  Take Eliquis exactly as prescribed and DO NOT stop taking Eliquis without talking to the doctor who prescribed the medication.  Stopping may increase your risk of developing a new blood clot.  Refill your prescription before you run out.  After discharge, you should have regular check-up appointments with your healthcare provider that is prescribing your Eliquis.    What do you do if you miss a dose? If a dose of ELIQUIS is not taken at the scheduled time, take it as soon as possible on the same day and twice-daily administration should be resumed. The dose should not be doubled to make up for a missed dose.  Important Safety Information A possible side effect of Eliquis is bleeding. You should call your healthcare provider right away if you experience any of the following: Bleeding from an injury or your nose that does not stop. Unusual colored urine (red or dark brown) or unusual colored stools (red or black). Unusual bruising for unknown reasons. A serious fall or if you  hit your head (even if there is no bleeding).  Some medicines may interact with Eliquis and might increase your risk of bleeding or clotting while on Eliquis. To help avoid this, consult your healthcare provider or pharmacist prior to using any new prescription or non-prescription medications, including herbals, vitamins, non-steroidal anti-inflammatory drugs (NSAIDs) and supplements.  This website has more information on Eliquis (apixaban): http://www.eliquis.com/eliquis/home

## 2023-04-17 NOTE — Progress Notes (Signed)
Patient Name: Stephen Walker Date of Encounter: 04/17/2023 [facility] Cardiologist: None   Interval Summary  .    Feeling good today, no complaints.  Still on supplemental oxygen and tachycardic but shortness of breath is improving.  No chest pain.  BP stable actually elevated.  Vital Signs .    Vitals:   04/16/23 1018 04/16/23 1532 04/16/23 2121 04/17/23 0549  BP: (!) 140/82 (!) 174/95 (!) 152/95 (!) 164/102  Pulse: 87 97 99 90  Resp: 20 18 15 14   Temp: 100.1 F (37.8 C) 99 F (37.2 C) 99 F (37.2 C) 99.7 F (37.6 C)  TempSrc: Oral Oral Oral Oral  SpO2: 100% (!) 84% 100% 98%  Weight:      Height:        Intake/Output Summary (Last 24 hours) at 04/17/2023 0945 Last data filed at 04/17/2023 0926 Gross per 24 hour  Intake 1431.94 ml  Output 900 ml  Net 531.94 ml      04/15/2023    9:10 PM 04/15/2023   12:39 PM 03/08/2020    9:17 AM  Last 3 Weights  Weight (lbs) 172 lb 6.4 oz 189 lb 9.5 oz 190 lb 6.4 oz  Weight (kg) 78.2 kg 86 kg 86.365 kg      Telemetry/ECG    Predominantly sinus rhythm, MAT, tachycardic 100s.  Did have an episode of supraventricular tachycardia for about 10 beats.  At times- Personally Reviewed  CV Studies    Echocardiogram 04/16/2023 1. Left ventricular ejection fraction, by estimation, is 65 to 70%. The  left ventricle has normal function. The left ventricle has no regional  wall motion abnormalities. There is mild left ventricular hypertrophy.  Left ventricular diastolic parameters  are indeterminate. There is the interventricular septum is flattened in  diastole ('D' shaped left ventricle), consistent with right ventricular  volume overload.   2. McConnell's sign. Right ventricular systolic function is moderately  reduced. The right ventricular size is severely enlarged. There is mildly  elevated pulmonary artery systolic pressure. The estimated right  ventricular systolic pressure is 43.1 mmHg.   3. Tricuspid valve  regurgitation is severe.   4. Left atrial size was mildly dilated.   5. Right atrial size was severely dilated.   6. The mitral valve is grossly normal. Trivial mitral valve  regurgitation. No evidence of mitral stenosis.   7. The aortic valve is tricuspid. Aortic valve regurgitation is not  visualized. No aortic stenosis is present.   8. The inferior vena cava is dilated in size with <50% respiratory  variability, suggesting right atrial pressure of 15 mmHg.   Physical Exam .   GEN: No acute distress.   Neck: No JVD Cardiac: RRR, no murmurs, rubs, or gallops.  Respiratory: Clear to auscultation bilaterally. GI: Soft, nontender, non-distended  MS: No edema  Patient Profile    [place] Salzberg is a 72 y.o. male has hx of  hypertension, heroin use, nicotine dependence, CKD  and admitted on 04/15/2023.  Cardiology saw for NSTEMI in the setting of large saddle PE.  Assessment & Plan .     NSTEMI Acute PE Admitted for acute large saddle PE with evidence of right heart strain and heroin withdrawal.  Troponins 5784-6962.  EKG showing diffuse nonspecific T wave abnormalities. He has had no prior chest pain, had 1 episode of left sharp chest pain here with no recurrence.  Suspect he may have some degree of CAD, however given isolated episode of chest pain in  the setting of PE with no prior cardiac complaints, suspect that his presentation is secondary to his PE rather than ACS.  Additionally, with recent heroin use and withdrawal concern of compliancy with DAPT is also significant.  Echocardiogram shows normal EF 65 to 70% with right heart strain.  There is interventricular septum flattening, McConnell's sign with moderately reduced RV function, severely enlarged RV size.  Severe TR.  Does not to be volume up.  Findings likely related to RV strain.  No plans for ischemic evaluation.  Continue treatment for his PE.  Does not appear to be any plans for intervention or lytics. Continue Toprol XL  50mg , statin   Heroin use with withdrawal Nicotine dependence half a pack per day Strongly encourage cessation.  He is willing to try Suboxone but not interested in methadone or counseling.  He is adamant that he can quit on his own as he has done so previously.  Relates his use to significant stressors including breast cancer, recent death of his son, job loss.   Hyperlipidemia Continue rosuvastatin 5 mg   Hypertension Likely acutely elevated secondary to above.  Would continue to treat PE and reassess need for antihypertensive medication.  For questions or updates, please contact [facility] Please consult [email or web address] for contact info under        Signed, Abagail Kitchens, PA-C

## 2023-04-17 NOTE — Progress Notes (Signed)
Spoke with Primary Nurse, PICC is not needed at this time and RN will d/c the order.

## 2023-04-17 NOTE — Progress Notes (Signed)
HOSPITALIST ROUNDING NOTE Stephen Walker ZDG:[id number]  DOB: [birth year]  DOA: 04/15/2023  [facility]P: [name]  04/17/2023,9:15 AM   LOS: 1 day      Code Status: Full From: Home  current Dispo: Likely home     72 year old male Known chronic heroin habituation since the 70s still using quit 4 days PTA --disliked methadone-went to [facility] and was placed on a "pill" for abstinence but never returned for follow-up Atypical chest pain in the past in 2010 HTN on medications Insomnia Current tobacco use half pack per day Allergy to lisinopril cannot tolerate ACE ARB--- was supposed to follow-up with cardiology and their recommendations in 2021 cataract cardiac CT and cancelled several appointments  Presented to emergency room 04/15/2023 with crushing chest pain--recent 3-hour travel to sister-in-law's place still chronic smoker--also apparently was trying to quit heroin and was sedentary at home  Found to have massive saddle embolus on CT chest with troponins  and 1500 range  BUN/creatinine 21/1.3  white count 15  11/7-cardiology consulted-echo shows submassive PE with McConnell sign and some right ventricular dysfunction--pulmonology consulted felt patient was relatively stable without need for embolectomy/lysis ---DVT study acute DVT right popliteal vein, acute DVT left popliteal and left peroneal and left distal femoral veins   Plan  Large saddle pulmonary embolism with at least submassive intermediate risk PE Heparin transitioned to Lovenox because of difficulty obtaining heparin levels and patient hemodynamically stable-appreciate of pulmonology/cardiology input Stop IV fluid Transition to DOAC either this evening or tomorrow morning but needs close monitoring Patient is eager to go home-I have clearly explained to hi-m the high risk of his PE and the need for continued close observation--- he understands  Elevated troponin SVT runs of about 13-20 beats Precipitated probably  by PE--will increase Toprol-XL to 100 daily--check am mag hold HCTZ --add back amlodipine 2.5, no further need for aspirin at this time  Leukocytosis on admission Related to hemodynamics/withdrawal from heroin-afebrile overall with only low-grade temps Would only workup if goes above 100.5 WBC is trending down on its own  Heroin habituation Seems to be managing withdrawal symptoms fairly well on Suboxone--- overnight his COWS scores have gone from 19-4 this morning He has a primary care physician at [facility] and I have encouraged him to reach out to them as I can only prescribe about a week to 10 days of meds for him for this indication  CKD 3 at baseline Probably mediated by altered hemodynamics hold HCTZ -improved--Saline lock  Mild hypokalemia Give K-Dur 40 improved  DVT prophylaxis: IV heparin  Status is: Observation The patient remains OBS appropriate and will d/c before 2 midnights.    Subjective:  Desat to 69% with ambulation-got winded At rest seems okay chest pain is little bit improved Asking to go home No dark stool no tarry stool  Objective + exam Vitals:   04/16/23 1018 04/16/23 1532 04/16/23 2121 04/17/23 0549  BP: (!) 140/82 (!) 174/95 (!) 152/95 (!) 164/102  Pulse: 87 97 99 90  Resp: 20 18 15 14   Temp: 100.1 F (37.8 C) 99 F (37.2 C) 99 F (37.2 C) 99.7 F (37.6 C)  TempSrc: Oral Oral Oral Oral  SpO2: 100% (!) 84% 100% 98%  Weight:      Height:       Filed Weights   04/15/23 1239 04/15/23 2110  Weight: 86 kg 78.2 kg    Examination:  Alert coherent pleasant Chest is relatively clear no wheezing S1-S2 no murmur noted  20 beats of SVT noted earlier today Abdomen is soft No lower extremity edema  Data Reviewed: reviewed   CBC    Component Value Date/Time   WBC 14.6 (H) 04/17/2023 0530   RBC 3.98 (L) 04/17/2023 0530   HGB 12.6 (L) 04/17/2023 0530   HCT 37.1 (L) 04/17/2023 0530   PLT 194 04/17/2023 0530   MCV 93.2 04/17/2023  0530   MCH 31.7 04/17/2023 0530   MCHC 34.0 04/17/2023 0530   RDW 14.2 04/17/2023 0530      Latest Ref Rng & Units 04/17/2023    5:30 AM 04/16/2023    4:57 AM 04/15/2023    3:30 PM  CMP  Glucose 70 - 99 mg/dL 409  811  914   BUN 8 - 23 mg/dL 22  27  21    Creatinine 0.61 - 1.24 mg/dL 7.82  9.56  2.13   Sodium 135 - 145 mmol/L 138  135  134   Potassium 3.5 - 5.1 mmol/L 3.5  3.1  3.6   Chloride 98 - 111 mmol/L 104  97  95   CO2 22 - 32 mmol/L 22  22  21    Calcium 8.9 - 10.3 mg/dL 8.9  9.4  08.6   Total Protein 6.5 - 8.1 g/dL 7.7   9.3   Total Bilirubin <1.2 mg/dL 0.7   1.2   Alkaline Phos 38 - 126 U/L 52   70   AST 15 - 41 U/L 24   38   ALT 0 - 44 U/L 22   26      Scheduled Meds:  buprenorphine-naloxone  1 tablet Sublingual BID   enoxaparin (LOVENOX) injection  80 mg Subcutaneous Q12H   metoprolol succinate  50 mg Oral Daily   potassium chloride  40 mEq Oral Daily   rosuvastatin  5 mg Oral Daily   Continuous Infusions:    Time  46  Rhetta Mura, MD  [facility]

## 2023-04-17 NOTE — Plan of Care (Signed)
  Problem: Education: Goal: Knowledge of General Education information will improve Description: Including pain rating scale, medication(s)/side effects and non-pharmacologic comfort measures Outcome: Progressing   Problem: Activity: Goal: Risk for activity intolerance will decrease Outcome: Progressing   Problem: Coping: Goal: Level of anxiety will decrease Outcome: Progressing   

## 2023-04-18 DIAGNOSIS — I214 Non-ST elevation (NSTEMI) myocardial infarction: Secondary | ICD-10-CM | POA: Diagnosis not present

## 2023-04-18 LAB — COMPREHENSIVE METABOLIC PANEL
ALT: 30 U/L (ref 0–44)
AST: 23 U/L (ref 15–41)
Albumin: 3.2 g/dL — ABNORMAL LOW (ref 3.5–5.0)
Alkaline Phosphatase: 58 U/L (ref 38–126)
Anion gap: 11 (ref 5–15)
BUN: 16 mg/dL (ref 8–23)
CO2: 23 mmol/L (ref 22–32)
Calcium: 8.9 mg/dL (ref 8.9–10.3)
Chloride: 101 mmol/L (ref 98–111)
Creatinine, Ser: 1.3 mg/dL — ABNORMAL HIGH (ref 0.61–1.24)
GFR, Estimated: 59 mL/min — ABNORMAL LOW (ref >60)
Glucose, Bld: 121 mg/dL — ABNORMAL HIGH (ref 70–99)
Potassium: 3.4 mmol/L — ABNORMAL LOW (ref 3.5–5.1)
Sodium: 135 mmol/L (ref 135–145)
Total Bilirubin: 0.7 mg/dL (ref <1.2)
Total Protein: 7.6 g/dL (ref 6.5–8.1)

## 2023-04-18 LAB — CBC
HCT: 41.5 % (ref 39.0–52.0)
Hemoglobin: 14.5 g/dL (ref 13.0–17.0)
MCH: 32.2 pg (ref 26.0–34.0)
MCHC: 34.9 g/dL (ref 30.0–36.0)
MCV: 92.2 fL (ref 80.0–100.0)
Platelets: 211 10*3/uL (ref 150–400)
RBC: 4.5 MIL/uL (ref 4.22–5.81)
RDW: 14.4 % (ref 11.5–15.5)
WBC: 10.4 10*3/uL (ref 4.0–10.5)
nRBC: 0 % (ref 0.0–0.2)

## 2023-04-18 MED ORDER — METOPROLOL SUCCINATE ER 50 MG PO TB24
150.0000 mg | ORAL_TABLET | Freq: Every day | ORAL | Status: DC
  Administered 2023-04-19: 150 mg via ORAL
  Filled 2023-04-18: qty 1

## 2023-04-18 NOTE — Progress Notes (Signed)
HOSPITALIST ROUNDING NOTE Stephen Walker [id number]:[id number]  DOB: 1950-11-19  DOA: 04/15/2023  PCP: [name]  04/18/2023,12:06 PM   LOS: 2 days      Code Status: Full From: Home  current Dispo: Likely home     72 year old male Known chronic heroin habituation since the 70s still using quit 4 days PTA --disliked methadone-went to [facility] and was placed on a "pill" for abstinence but never returned for follow-up Atypical chest pain in the past in 2010 HTN on medications Insomnia Current tobacco use half pack per day Allergy to lisinopril cannot tolerate ACE ARB--- was supposed to follow-up with cardiology and their recommendations in 2021 cataract cardiac CT and cancelled several appointments  Presented to emergency room 04/15/2023 with crushing chest pain--recent 3-hour travel to sister-in-law's place still chronic smoker--also apparently was trying to quit heroin and was sedentary at home  Found to have massive saddle embolus on CT chest with troponins  and 1500 range  BUN/creatinine 21/1.3  white count 15  11/7-cardiology consulted-echo shows submassive PE with McConnell sign and some right ventricular dysfunction--pulmonology consulted felt patient was relatively stable without need for embolectomy/lysis ---DVT study acute DVT right popliteal vein, acute DVT left popliteal and left peroneal and left distal femoral veins   Plan  Large saddle pulmonary embolism with at least submassive intermediate risk PE pulmonology/cardiology input signed off Heparin--Lovenox-->currently on Eliquis, likely at least 12 months  Elevated troponin SVT runs of about 13-20 beats Precipitated probably by PE--will increase Toprol-XL to 150 XL daily as quite tachycardic still, continue amlodipine 2.5 hold aspirin   Leukocytosis on admission Related to hemodynamics/withdrawal from heroin-afebrile overall with only low-grade temps DC trending to normal  Heroin habituation Seems to be  managing withdrawal symptoms fairly well on Suboxone--- continue Valium 5 every 6 as needed anxiety hydroxyzine 25 every 6 as needed anxiety trazodone at bedtime 50 as well as Bentyl 20 every 6 spasm discomfort Follow-up outpatient [name] to get prescriptions long-term  CKD 3 at baseline Stable at this time  Mild hypokalemia Give K-Dur 40 improved-recheck labs in a.m.  DVT prophylaxis: IV heparin  Status is: Observation The patient remains OBS appropriate and will d/c before 2 midnights.    Subjective:  Looks comfortable sitting up in bed has not walked today no chest pain no fever Still little tachycardic No nausea no vomiting  Objective + exam Vitals:   04/17/23 2051 04/18/23 0034 04/18/23 0444 04/18/23 0959  BP: (!) 160/99 (!) 148/96 (!) 158/104 (!) 143/106  Pulse: 99 (!) 125 91 (!) 112  Resp: 16 20 20 16   Temp: 99.9 F (37.7 C) 99.8 F (37.7 C) 99.9 F (37.7 C) 99.5 F (37.5 C)  TempSrc: Oral Oral Oral Oral  SpO2: 97% 97% 98% 97%  Weight:      Height:       Filed Weights   04/15/23 1239 04/15/23 2110  Weight: 86 kg 78.2 kg    Examination:  Alert coherent pleasant CTAB no added sound wheeze rales rhonchi S1-S2 no murmur No further SVT Abdomen soft no rebound ROM intact  Data Reviewed: reviewed   CBC    Component Value Date/Time   WBC 10.4 04/18/2023 0540   RBC 4.50 04/18/2023 0540   HGB 14.5 04/18/2023 0540   HCT 41.5 04/18/2023 0540   PLT 211 04/18/2023 0540   MCV 92.2 04/18/2023 0540   MCH 32.2 04/18/2023 0540   MCHC 34.9 04/18/2023 0540   RDW 14.4 04/18/2023 0540  Latest Ref Rng & Units 04/18/2023    5:40 AM 04/17/2023    5:30 AM 04/16/2023    4:57 AM  CMP  Glucose 70 - 99 mg/dL 829  562  130   BUN 8 - 23 mg/dL 16  22  27    Creatinine 0.61 - 1.24 mg/dL 8.65  7.84  6.96   Sodium 135 - 145 mmol/L 135  138  135   Potassium 3.5 - 5.1 mmol/L 3.4  3.5  3.1   Chloride 98 - 111 mmol/L 101  104  97   CO2 22 - 32 mmol/L 23  22  22     Calcium 8.9 - 10.3 mg/dL 8.9  8.9  9.4   Total Protein 6.5 - 8.1 g/dL 7.6  7.7    Total Bilirubin <1.2 mg/dL 0.7  0.7    Alkaline Phos 38 - 126 U/L 58  52    AST 15 - 41 U/L 23  24    ALT 0 - 44 U/L 30  22       Scheduled Meds:  amLODipine  2.5 mg Oral Daily   apixaban  10 mg Oral BID   Followed by   Melene Muller ON 04/24/2023] apixaban  5 mg Oral BID   buprenorphine-naloxone  1 tablet Sublingual BID   [START ON 04/19/2023] metoprolol succinate  150 mg Oral Daily   potassium chloride  40 mEq Oral Daily   rosuvastatin  5 mg Oral Daily   tamsulosin  0.4 mg Oral QPC breakfast   traZODone  50 mg Oral QHS   Continuous Infusions:    Time  46  Rhetta Mura, MD  [facility]

## 2023-04-18 NOTE — Plan of Care (Signed)
°  Problem: Coping: °Goal: Level of anxiety will decrease °Outcome: Progressing °  °

## 2023-04-18 NOTE — Plan of Care (Signed)
?  Problem: Coping: ?Goal: Level of anxiety will decrease ?Outcome: Progressing ?  ?Problem: Safety: ?Goal: Ability to remain free from injury will improve ?Outcome: Progressing ?  ?

## 2023-04-19 DIAGNOSIS — I214 Non-ST elevation (NSTEMI) myocardial infarction: Secondary | ICD-10-CM | POA: Diagnosis not present

## 2023-04-19 LAB — COMPREHENSIVE METABOLIC PANEL
ALT: 24 U/L (ref 0–44)
AST: 18 U/L (ref 15–41)
Albumin: 2.9 g/dL — ABNORMAL LOW (ref 3.5–5.0)
Alkaline Phosphatase: 48 U/L (ref 38–126)
Anion gap: 7 (ref 5–15)
BUN: 11 mg/dL (ref 8–23)
CO2: 23 mmol/L (ref 22–32)
Calcium: 9 mg/dL (ref 8.9–10.3)
Chloride: 105 mmol/L (ref 98–111)
Creatinine, Ser: 1.27 mg/dL — ABNORMAL HIGH (ref 0.61–1.24)
GFR, Estimated: >60 mL/min (ref >60)
Glucose, Bld: 119 mg/dL — ABNORMAL HIGH (ref 70–99)
Potassium: 3.9 mmol/L (ref 3.5–5.1)
Sodium: 135 mmol/L (ref 135–145)
Total Bilirubin: 0.7 mg/dL (ref <1.2)
Total Protein: 7.3 g/dL (ref 6.5–8.1)

## 2023-04-19 LAB — CBC
HCT: 42.6 % (ref 39.0–52.0)
Hemoglobin: 14.6 g/dL (ref 13.0–17.0)
MCH: 31.7 pg (ref 26.0–34.0)
MCHC: 34.3 g/dL (ref 30.0–36.0)
MCV: 92.6 fL (ref 80.0–100.0)
Platelets: 250 10*3/uL (ref 150–400)
RBC: 4.6 MIL/uL (ref 4.22–5.81)
RDW: 14.4 % (ref 11.5–15.5)
WBC: 9.7 10*3/uL (ref 4.0–10.5)
nRBC: 0 % (ref 0.0–0.2)

## 2023-04-19 MED ORDER — METOPROLOL SUCCINATE ER 100 MG PO TB24: 150.0000 mg | ORAL_TABLET | Freq: Every day | ORAL | 1 refills | Status: DC

## 2023-04-19 NOTE — Progress Notes (Signed)
Discharge medications were delivered on Friday to pt in room by [facility] pharmacy. This was confirmed by this RN after a call to inpatient pharmacy. Thios RN confirmed medications were still sealed in the secure bag. Only medication Austinmichael needs to pick up at this time is the metoprolol at [facility] on [facility] today. Pt verbalized an understanding of the AVS and that he needs to follow up w/ [name] to get his labs rechecked and for refills. No other questions at this time. Pt to lobby via w/c- home w/ s/o

## 2023-04-19 NOTE — Discharge Summary (Signed)
Physician Discharge Summary  Stephen Walker IEP:[id number] DOB: July 21, 1950 DOA: 04/15/2023  PCP: [name]  Admit date: 04/15/2023 Discharge date: 04/19/2023  Time spent: 56 minutes  Recommendations for Outpatient Follow-up:  Requires nonemergent CT scan in the outpatient setting for lung cancer screening given prior history of smoking Requires at least 9 to 12 months of uninterrupted anticoagulation secondary to saddle embolus and right heart strain Please titrate his beta-blocker in the outpatient setting for heart rate control-this is secondary to PE physiology and he should improve He will need counseling in addition to Suboxone for heroin habituation-he is willing to quit and seems motivated Obtain CBC Chem-12 in about 1-2 weeks at PCP office  Discharge Diagnoses:  MAIN problem for hospitalization   Saddle pulmonary embolism submassive intermediate risk PE Troponin leak with some SVT probably from cardiac irritability from PE Leukocytosis from PE possible pulmonary infarct-stable Chronic heroin habituation CKD 3  Please see below for itemized issues addressed in HOpsital- refer to other progress notes for clarity if needed  Discharge Condition: Improved  Diet recommendation: Heart healthy  Filed Weights   04/15/23 1239 04/15/23 2110  Weight: 86 kg 78.2 kg    History of present illness:  72 year old male Known chronic heroin habituation since the 70s still using quit 4 days PTA --disliked methadone-went to [facility] and was placed on a "pill" for abstinence but never returned for follow-up Atypical chest pain in the past in 2010 HTN on medications Insomnia Current tobacco use half pack per day Allergy to lisinopril cannot tolerate ACE ARB--- was supposed to follow-up with cardiology and their recommendations in 2021 cataract cardiac CT and cancelled several appointments   Presented to emergency room 04/15/2023 with crushing chest pain--recent 3-hour  travel to sister-in-law's place still chronic smoker--also apparently was trying to quit heroin and was sedentary at home   Found to have massive saddle embolus on CT chest with troponins  and 1500 range  BUN/creatinine 21/1.3  white count 15  11/7-cardiology consulted-echo shows submassive PE with McConnell sign and some right ventricular dysfunction--pulmonology consulted felt patient was relatively stable without need for embolectomy/lysis ---DVT study acute DVT right popliteal vein, acute DVT left popliteal and left peroneal and left distal femoral veins  Hospital Course:  Large saddle pulmonary embolism with at least submassive intermediate risk PE pulmonology/cardiology input signed off Heparin--Lovenox-->currently on Eliquis, likely at least 12 months He is quite stable hemodynamically and he will be tested for oxygen requirement at discharge although I do not think he will need He needs to remain active at told him to increase activity slowly however He has no chest pain at discharge and seems quite stable Warning signs of bleeding and side effects of blood thinner were explained clearly to him   Elevated troponin SVT runs of about 13-20 beats on 11/8 Precipitated probably by PE--we did adjust his beta-blocker to Toprol-XL 150 daily I am not sure if he was compliant He will need to continue amlodipine 2.5 We have discontinued his HCTZ mainly because he had some mild CKD and he can have his amlodipine addressed and hold aspirin at this time and he will benefit in the outpatient setting from discussion regarding the same given he is on a blood thinner   Leukocytosis on admission Related to hemodynamics/withdrawal from heroin-afebrile overall with only low-grade temps DC trending to normal He had low-grade fevers but absolutely no cough dysuria diarrhea or rash and I feel this was all related to PE physiology I have  counseled him to follow-up with PCP for workup and given warning  signs   Heroin habituation Seems to be managing withdrawal symptoms fairly well on Suboxone--- continue Valium at discharge as he will be under the supervision of his wife and he is aware that the withdrawal process as well as remedies for this include counseling-we discussed this at discharge and he is aware He is also aware I can only prescribe a limited amount of Suboxone and he will need follow-up outpatient [name] to get prescriptions long-term   CKD 3 at baseline Stable at this time-see above discussion   Mild hypokalemia Give K-Dur 40 improved-recheck labs i worse stable and he will need labs in a week   Discharge Exam: Vitals:   04/18/23 2022 04/19/23 0352  BP: (!) 144/101 (!) 143/105  Pulse: (!) 105 (!) 104  Resp: 20 20  Temp: 100.2 F (37.9 C) 99.9 F (37.7 C)  SpO2: 94% 98%    Subj on day of d/c   Awake coherent no distress Looks comfortable No fever chills nausea vomiting melena  General Exam on discharge  EOMI NCAT no icterus no pallor Chest clear slightly decreased air entry right posterior lung field S1-S2 no murmur Abdomen soft no rebound no guarding No lower extremity edema Abdomen soft no rebound Power 5/5  Discharge Instructions   Discharge Instructions     Ambulatory Referral for Lung Cancer Scre   Complete by: As directed    Diet - low sodium heart healthy   Complete by: As directed    Discharge instructions   Complete by: As directed    This hospitalization you were diagnosed with a relatively large blood clot in your lungs stemming from a blood clot in both of your lower extremities-this was likely secondary to being sedentary-it would be a good idea that you increase your activity slowly, get a hand-held pulse ox to check your pulse and check your oxygen levels and challenge herself over the next 1 to 2 weeks to improve your stamina I would recommend strongly that you cease any smoking related products and definitely trying to quit  your heroin habituation-you have been given the opportunity to use Suboxone under the tongue and will need to follow-up with [facility] to get refills as I am unfortunately restricted from prescribing more than a small supply of this Valium should help you with the withdrawal symptoms and eventually your cravings will subside-it may have Nplate you to seek some counseling in addition to help with cessation and we did discuss this It is quite reasonable for you to get labs in about 1 week follow-up with [facility] blood thinner should not have any overt side effects with diet such as grapefruit and/or coffee We will check you before you leave the hospital for oxygen needs but I do not suspect he will need any  I suspect that some of your low-grade fevers are potentially secondary to the lung clot itself because it was so large it may have affected your lungs and decreased blood supply to 1 area of the lung-there is nothing really I would do differently however and I do think that you will improve  If you have high-grade fevers of about 100.5 401 probably to be seen by a primary physician  Because you have a history of smoking I would recommend that you follow-up for a lung cancer screening with a CT scan which would also answer questions in about 2 to 3 months or so with  regards to if there was any further clot   Increase activity slowly   Complete by: As directed       Allergies as of 04/19/2023       Reactions   Lisinopril Swelling, Other (See Comments)   Lips swell   Crestor [rosuvastatin] Other (See Comments)   Made the patient "not feel well"   Lactose Diarrhea   Metformin And Related Other (See Comments)   Made the patient "not feel well"   Pravastatin Other (See Comments)   Made the patient "not feel well"        Medication List     STOP taking these medications    hydrochlorothiazide 12.5 MG tablet Commonly known as: HYDRODIURIL   hydrochlorothiazide 25 MG  tablet Commonly known as: HYDRODIURIL   hydrOXYzine 25 MG tablet Commonly known as: ATARAX   metoprolol tartrate 50 MG tablet Commonly known as: LOPRESSOR       TAKE these medications    amLODipine 2.5 MG tablet Commonly known as: NORVASC Take 1 tablet (2.5 mg total) by mouth daily as needed (For systolic blood pressure greater than 130 (top number)).   buprenorphine-naloxone 8-2 mg Subl SL tablet Commonly known as: SUBOXONE Place 1 tablet under the tongue 2 (two) times daily.   diazepam 5 MG tablet Commonly known as: VALIUM Take 1 tablet (5 mg total) by mouth every 6 (six) hours as needed for anxiety.   Eliquis DVT/PE Starter Pack Generic drug: Apixaban Starter Pack (10mg  and 5mg ) Take as directed on package: start with two-5mg  tablets twice daily for 7 days. On day 8, switch to one-5mg  tablet twice daily.   metoprolol succinate 100 MG 24 hr tablet Commonly known as: TOPROL-XL Take 1.5 tablets (150 mg total) by mouth daily. Take with or immediately following a meal. Please make overdue appt with Dr. Shari Prows before anymore refills. Thank you 1st attempt What changed: how much to take   rosuvastatin 10 MG tablet Commonly known as: CRESTOR Take 1 tablet (10 mg total) by mouth daily. Please make overdue appt with Dr. Shari Prows before anymore refills. Thank you 1st attempt   traZODone 50 MG tablet Commonly known as: DESYREL TAKE 1 TABLET BY MOUTH AT  BEDTIME What changed:  when to take this reasons to take this       Allergies  Allergen Reactions   Lisinopril Swelling and Other (See Comments)    Lips swell   Crestor [Rosuvastatin] Other (See Comments)    Made the patient "not feel well"   Lactose Diarrhea   Metformin And Related Other (See Comments)    Made the patient "not feel well"   Pravastatin Other (See Comments)    Made the patient "not feel well"      The results of significant diagnostics from this hospitalization (including imaging,  microbiology, ancillary and laboratory) are listed below for reference.    Significant Diagnostic Studies: VAS [place] LOWER EXTREMITY VENOUS (DVT)  Result Date: 04/16/2023  Lower Venous DVT Study Patient Name:  Stephen Walker  Date of Exam:   04/16/2023 Medical Rec #: [id number]          Accession #:    [id number] Date of Birth: 11-09-1950          Patient Gender: M Patient Age:   25 years Exam Location:  [facility] [place] Procedure:      VAS [place] LOWER EXTREMITY VENOUS (DVT) Referring Phys: Rhetta Mura --------------------------------------------------------------------------------  Indications: Pulmonary embolism.  Comparison Study: No previous exams  Performing Technologist: Ernestene Mention RVT, RDMS  Examination Guidelines: A complete evaluation includes B-mode imaging, spectral Doppler, color Doppler, and power Doppler as needed of all accessible portions of each vessel. Bilateral testing is considered an integral part of a complete examination. Limited examinations for reoccurring indications may be performed as noted. The reflux portion of the exam is performed with the patient in reverse Trendelenburg.  +---------+---------------+---------+-----------+----------+--------------+ RIGHT    CompressibilityPhasicitySpontaneityPropertiesThrombus Aging +---------+---------------+---------+-----------+----------+--------------+ CFV      Full           No       Yes                                 +---------+---------------+---------+-----------+----------+--------------+ SFJ      Full                                                        +---------+---------------+---------+-----------+----------+--------------+ FV Prox  Full           No       Yes                                 +---------+---------------+---------+-----------+----------+--------------+ FV Mid   Full           No       Yes                                  +---------+---------------+---------+-----------+----------+--------------+ FV DistalFull           No       Yes                                 +---------+---------------+---------+-----------+----------+--------------+ PFV      Full                                                        +---------+---------------+---------+-----------+----------+--------------+ POP      Partial        No       Yes                  Acute          +---------+---------------+---------+-----------+----------+--------------+ PTV      Full                                                        +---------+---------------+---------+-----------+----------+--------------+ PERO     Full                                                        +---------+---------------+---------+-----------+----------+--------------+   +---------+---------------+---------+-----------+----------+------------------+  LEFT     CompressibilityPhasicitySpontaneityPropertiesThrombus Aging     +---------+---------------+---------+-----------+----------+------------------+ CFV      Full           No       Yes                                     +---------+---------------+---------+-----------+----------+------------------+ SFJ      Full                                                            +---------+---------------+---------+-----------+----------+------------------+ FV Prox  Full           No       Yes                                     +---------+---------------+---------+-----------+----------+------------------+ FV Mid   Full           No       Yes                                     +---------+---------------+---------+-----------+----------+------------------+ FV DistalNone           No       Yes                  Acute              +---------+---------------+---------+-----------+----------+------------------+ PFV      Full                                                             +---------+---------------+---------+-----------+----------+------------------+ POP      Partial        No       No                   Acute              +---------+---------------+---------+-----------+----------+------------------+ PTV      Full                                                            +---------+---------------+---------+-----------+----------+------------------+ PERO     Partial        No       No                   Acute - one of  paired             +---------+---------------+---------+-----------+----------+------------------+     Summary: RIGHT: - Findings consistent with acute deep vein thrombosis involving the right popliteal vein.   LEFT: - Findings consistent with acute deep vein thrombosis involving the left popliteal vein, left peroneal veins, and left distal femoral vein.   *See table(s) above for measurements and observations. Electronically signed by Heath Lark on 04/16/2023 at 6:06:59 PM.    Final    [place] EKG SITE RITE  Result Date: 04/16/2023 If Site Rite image not attached, placement could not be confirmed due to current cardiac rhythm.  ECHOCARDIOGRAM COMPLETE  Result Date: 04/16/2023    ECHOCARDIOGRAM REPORT   Patient Name:   Stephen Walker Date of Exam: 04/16/2023 Medical Rec #:  [id number]         Height:       73.0 in Accession #:    [id number]        Weight:       172.4 lb Date of Birth:  [birth year]         BSA:          2.020 m Patient Age:    72 years          BP:           165/108 mmHg Patient Gender: M                 HR:           87 bpm. Exam Location:  Inpatient Procedure: 2D Echo, Cardiac Doppler and Color Doppler Indications:    I26.02 Pulmonary embolus  History:        Patient has no prior history of Echocardiogram examinations.                 Abnormal ECG, Arrythmias:Tachycardia, Signs/Symptoms:Chest Pain;                 Risk Factors:Hypertension. Elevated  troponin. Polysuvstance                 abuse. Heroin use.  Sonographer:    Sheralyn Boatman RDCS Referring Phys: [id number] Flossie Buffy [facility]  Sonographer Comments: Technically difficult study due to poor echo windows. Patient talking throughout exam. IMPRESSIONS  1. Left ventricular ejection fraction, by estimation, is 65 to 70%. The left ventricle has normal function. The left ventricle has no regional wall motion abnormalities. There is mild left ventricular hypertrophy. Left ventricular diastolic parameters are indeterminate. There is the interventricular septum is flattened in diastole ('D' shaped left ventricle), consistent with right ventricular volume overload.  2. McConnell's sign. Right ventricular systolic function is moderately reduced. The right ventricular size is severely enlarged. There is mildly elevated pulmonary artery systolic pressure. The estimated right ventricular systolic pressure is 43.1 mmHg.  3. Tricuspid valve regurgitation is severe.  4. Left atrial size was mildly dilated.  5. Right atrial size was severely dilated.  6. The mitral valve is grossly normal. Trivial mitral valve regurgitation. No evidence of mitral stenosis.  7. The aortic valve is tricuspid. Aortic valve regurgitation is not visualized. No aortic stenosis is present.  8. The inferior vena cava is dilated in size with <50% respiratory variability, suggesting right atrial pressure of 15 mmHg. FINDINGS  Left Ventricle: Left ventricular ejection fraction, by estimation, is 65 to 70%. The left ventricle has normal function. The left ventricle has no regional wall motion abnormalities. The left ventricular internal cavity size was normal in  size. There is  mild left ventricular hypertrophy. The interventricular septum is flattened in diastole ('D' shaped left ventricle), consistent with right ventricular volume overload. Left ventricular diastolic parameters are indeterminate. Right Ventricle: McConnell's sign. The right ventricular  size is severely enlarged. No increase in right ventricular wall thickness. Right ventricular systolic function is moderately reduced. There is mildly elevated pulmonary artery systolic pressure. The tricuspid regurgitant velocity is 2.65 m/s, and with an assumed right atrial pressure of 15 mmHg, the estimated right ventricular systolic pressure is 43.1 mmHg. Left Atrium: Left atrial size was mildly dilated. Right Atrium: Right atrial size was severely dilated. Pericardium: There is no evidence of pericardial effusion. Mitral Valve: The mitral valve is grossly normal. Trivial mitral valve regurgitation. No evidence of mitral valve stenosis. Tricuspid Valve: The tricuspid valve is grossly normal. Tricuspid valve regurgitation is severe. No evidence of tricuspid stenosis. Aortic Valve: The aortic valve is tricuspid. Aortic valve regurgitation is not visualized. No aortic stenosis is present. Pulmonic Valve: The pulmonic valve was normal in structure. Pulmonic valve regurgitation is trivial. No evidence of pulmonic stenosis. Aorta: The aortic root and ascending aorta are structurally normal, with no evidence of dilitation. Venous: The inferior vena cava is dilated in size with less than 50% respiratory variability, suggesting right atrial pressure of 15 mmHg. The inferior vena cava and the hepatic vein show a pattern of systolic flow reversal, suggestive of tricuspid regurgitation. IAS/Shunts: No atrial level shunt detected by color flow Doppler.  LEFT VENTRICLE PLAX 2D LVIDd:         3.90 cm     Diastology LVIDs:         2.60 cm     LV e' medial:    5.66 cm/s LV PW:         1.20 cm     LV E/e' medial:  5.4 LV IVS:        1.30 cm     LV e' lateral:   6.31 cm/s LVOT diam:     2.10 cm     LV E/e' lateral: 4.8 LV SV:         47 LV SV Index:   23 LVOT Area:     3.46 cm  LV Volumes (MOD) LV vol d, MOD A2C: 49.5 ml LV vol d, MOD A4C: 42.6 ml LV vol s, MOD A2C: 14.5 ml LV vol s, MOD A4C: 17.4 ml LV SV MOD A2C:     35.0 ml LV  SV MOD A4C:     42.6 ml LV SV MOD BP:      31.6 ml RIGHT VENTRICLE            IVC RV S prime:     9.79 cm/s  IVC diam: 2.30 cm TAPSE (M-mode): 1.5 cm LEFT ATRIUM           Index        RIGHT ATRIUM           Index LA diam:      2.80 cm 1.39 cm/m   RA Area:     26.30 cm LA Vol (A2C): 45.3 ml 22.43 ml/m  RA Volume:   97.40 ml  48.22 ml/m LA Vol (A4C): 44.0 ml 21.78 ml/m  AORTIC VALVE LVOT Vmax:   100.00 cm/s LVOT Vmean:  60.600 cm/s LVOT VTI:    0.135 m  AORTA Ao Root diam: 3.60 cm Ao Asc diam:  3.60 cm MITRAL VALVE  TRICUSPID VALVE MV Area (PHT): 4.06 cm    TR Peak grad:   28.1 mmHg MV Decel Time: 187 msec    TR Vmax:        265.00 cm/s MV E velocity: 30.60 cm/s MV A velocity: 56.80 cm/s  SHUNTS MV E/A ratio:  0.54        Systemic VTI:  0.14 m                            Systemic Diam: 2.10 cm Weston Brass MD Electronically signed by Weston Brass MD Signature Date/Time: 04/16/2023/1:33:48 PM    Final    CT Angio Chest Pulmonary Embolism (PE) W or WO Contrast  Result Date: 04/15/2023 CLINICAL DATA:  Chest pain radiating to the back. Concern for aortic dissection or pulmonary embolism. EXAM: CT ANGIOGRAPHY CHEST WITH CONTRAST TECHNIQUE: Multidetector CT imaging of the chest was performed using the standard protocol during bolus administration of intravenous contrast. Multiplanar CT image reconstructions and MIPs were obtained to evaluate the vascular anatomy. RADIATION DOSE REDUCTION: This exam was performed according to the departmental dose-optimization program which includes automated exposure control, adjustment of the mA and/or kV according to patient size and/or use of iterative reconstruction technique. CONTRAST:  80mL OMNIPAQUE IOHEXOL 350 MG/ML SOLN COMPARISON:  Chest radiograph dated 04/15/2023. FINDINGS: Cardiovascular: There is mild dilatation of the right heart chambers with RV/LV ratio of 2.5 consistent with right heart straining. Retrograde flow of contrast from the right  atrium into the IVC suggestive of right heart dysfunction. The thoracic aorta is unremarkable. There is a large saddle embolus straddling the bifurcation of the pulmonary trunk and extending from the central pulmonary arteries bilaterally. Mediastinum/Nodes: No hilar or mediastinal adenopathy. The esophagus is grossly unremarkable. No mediastinal fluid collection. Lungs/Pleura: Background of severe emphysema. No focal consolidation, pleural effusion, or pneumothorax. The central airways are patent. Upper Abdomen: No acute abnormality. Musculoskeletal: No acute osseous pathology. Review of the MIP images confirms the above findings. IMPRESSION: Large saddle embolus. Positive for acute PE with CT evidence of right heart strain (RV/LV Ratio = 2.5) consistent with at least submassive (intermediate risk) PE. The presence of right heart strain has been associated with an increased risk of morbidity and mortality. Please refer to the "Code PE Focused" order set in EPIC. These results were called by telephone at the time of interpretation on 04/15/2023 at 7:08 pm to Dr. Rodena Medin, who verbally acknowledged these results. Electronically Signed   By: Elgie Collard M.D.   On: 04/15/2023 19:19   DG Chest 2 View  Result Date: 04/15/2023 CLINICAL DATA:  Chest pain.  Shortness of breath. EXAM: CHEST - 2 VIEW COMPARISON:  None Available. FINDINGS: The heart size and mediastinal contours are within normal limits. Elevation of the left hemidiaphragm. No focal consolidation, pneumothorax, or pleural effusion. No acute osseous abnormality. IMPRESSION: Elevation of the left hemidiaphragm. Otherwise, no acute cardiopulmonary findings. Electronically Signed   By: Hart Robinsons M.D.   On: 04/15/2023 16:05    Microbiology: Recent Results (from the past 240 hour(s))  Culture, blood (Routine X 2) w Reflex to ID Panel     Status: None (Preliminary result)   Collection Time: 04/16/23 12:30 AM   Specimen: BLOOD LEFT ARM  Result  Value Ref Range Status   Specimen Description   Final    BLOOD LEFT ARM Performed at [facility], [address], [place], [place] [address]    Special  Requests   Final    BOTTLES DRAWN AEROBIC ONLY Blood Culture adequate volume Performed at [facility], [address] [place], [place] [address]    Culture   Final    NO GROWTH 3 DAYS Performed at [facility], [address], [place], [place] [address]    Report Status PENDING  Incomplete  Culture, blood (Routine X 2) w Reflex to ID Panel     Status: None (Preliminary result)   Collection Time: 04/16/23 12:30 AM   Specimen: BLOOD LEFT HAND  Result Value Ref Range Status   Specimen Description   Final    BLOOD LEFT HAND Performed at [facility], [address], [place], [place] [address]    Special Requests   Final    BOTTLES DRAWN AEROBIC ONLY Blood Culture adequate volume Performed at [facility], [address] [place], [place] [address]    Culture   Final    NO GROWTH 3 DAYS Performed at [facility], [address] [place], [place] [address]    Report Status PENDING  Incomplete     Labs: Basic Metabolic Panel: Recent Labs  Lab 04/15/23 1530 04/16/23 0457 04/17/23 0530 04/18/23 0540 04/19/23 0533  NA 134* 135 138 135 135  K 3.6 3.1* 3.5 3.4* 3.9  CL 95* 97* 104 101 105  CO2 21* 22 22 23 23   GLUCOSE 163* 139* 118* 121* 119*  BUN 21 27* 22 16 11   CREATININE 1.38* 1.51* 1.30* 1.30* 1.27*  CALCIUM 10.2 9.4 8.9 8.9 9.0  MG  --  1.8 1.8  --   --    Liver Function Tests: Recent Labs  Lab 04/15/23 1530 04/17/23 0530 04/18/23 0540 04/19/23 0533  AST 38 24 23 18   ALT 26 22 30 24   ALKPHOS 70 52 58 48  BILITOT 1.2* 0.7 0.7 0.7  PROT 9.3* 7.7 7.6 7.3  ALBUMIN 4.1 3.3* 3.2* 2.9*   Recent Labs  Lab 04/15/23 1530  LIPASE 39   No results for input(s): "AMMONIA" in the last 168 hours. CBC: Recent Labs  Lab  04/15/23 1530 04/16/23 0457 04/17/23 0530 04/18/23 0540 04/19/23 0533  WBC 15.0* 18.8* 14.6* 10.4 9.7  HGB 15.5 14.0 12.6* 14.5 14.6  HCT 44.5 40.8 37.1* 41.5 42.6  MCV 91.0 90.5 93.2 92.2 92.6  PLT 217 231 194 211 250   Cardiac Enzymes: No results for input(s): "CKTOTAL", "CKMB", "CKMBINDEX", "TROPONINI" in the last 168 hours. BNP: BNP (last 3 results) No results for input(s): "BNP" in the last 8760 hours.  ProBNP (last 3 results) No results for input(s): "PROBNP" in the last 8760 hours.  CBG: No results for input(s): "GLUCAP" in the last 168 hours.     Signed:  Rhetta Mura MD   [facility] 04/19/2023, 8:48 AM

## 2023-04-19 NOTE — Progress Notes (Signed)
SATURATION QUALIFICATIONS: (This note is used to comply with regulatory documentation for home oxygen)  Patient Saturations on Room Air at Rest = 97%  Patient Saturations on Room Air while Ambulating = 95-99%.  HR [phone]

## 2023-04-19 NOTE — Plan of Care (Signed)

## 2023-04-19 NOTE — Plan of Care (Signed)
  Problem: Education: Goal: Knowledge of General Education information will improve Description: Including pain rating scale, medication(s)/side effects and non-pharmacologic comfort measures Outcome: Progressing   Problem: Health Behavior/Discharge Planning: Goal: Ability to manage health-related needs will improve Outcome: Progressing   Problem: Clinical Measurements: Goal: Ability to maintain clinical measurements within normal limits will improve Outcome: Progressing Goal: Will remain free from infection Outcome: Progressing Goal: Diagnostic test results will improve Outcome: Progressing Goal: Respiratory complications will improve Outcome: Progressing Goal: Cardiovascular complication will be avoided Outcome: Progressing   Problem: Activity: Goal: Risk for activity intolerance will decrease Outcome: Progressing   Problem: Pain Management: Goal: General experience of comfort will improve Outcome: Progressing

## 2023-04-19 NOTE — Progress Notes (Signed)
Patient given discharge, medication, and follow up instructions, verbalized understanding, IV x 2 and telemetry monitor removed, personal belongings with patient, spouse to transport home

## 2023-04-19 NOTE — TOC Transition Note (Signed)
Transition of Care [facility]) - CM/SW Discharge Note   Patient Details  Name: Stephen Walker MRN: [id number] Date of Birth: [birth year]  Transition of Care [facility]) CM/SW Contact:  Adrian Prows, RN Phone Number: 04/19/2023, 9:28 AM   Clinical Narrative:    D/C orders received; no TOC needs.   Final next level of care: Home/Self Care Barriers to Discharge: No Barriers Identified   Patient Goals and CMS Choice      Discharge Placement                         Discharge Plan and Services Additional resources added to the After Visit Summary for                                       Social Determinants of Health (SDOH) Interventions SDOH Screenings   Food Insecurity: No Food Insecurity (04/15/2023)  Housing: Low Risk  (04/15/2023)  Transportation Needs: No Transportation Needs (04/15/2023)  Utilities: Not At Risk (04/15/2023)  Depression (PHQ2-9): Low Risk  (09/15/2018)  Tobacco Use: High Risk (04/15/2023)     Readmission Risk Interventions     No data to display

## 2023-04-21 LAB — CULTURE, BLOOD (ROUTINE X 2)
Culture: NO GROWTH
Culture: NO GROWTH
Special Requests: ADEQUATE
Special Requests: ADEQUATE

## 2023-04-29 DIAGNOSIS — F1123 Opioid dependence with withdrawal: Secondary | ICD-10-CM | POA: Diagnosis not present

## 2023-04-29 DIAGNOSIS — I2699 Other pulmonary embolism without acute cor pulmonale: Secondary | ICD-10-CM | POA: Diagnosis not present

## 2023-04-29 DIAGNOSIS — Z79899 Other long term (current) drug therapy: Secondary | ICD-10-CM | POA: Diagnosis not present

## 2023-04-29 DIAGNOSIS — G4700 Insomnia, unspecified: Secondary | ICD-10-CM | POA: Diagnosis not present

## 2023-04-29 DIAGNOSIS — Z09 Encounter for follow-up examination after completed treatment for conditions other than malignant neoplasm: Secondary | ICD-10-CM | POA: Diagnosis not present

## 2023-04-29 DIAGNOSIS — E119 Type 2 diabetes mellitus without complications: Secondary | ICD-10-CM | POA: Diagnosis not present

## 2023-04-29 DIAGNOSIS — N183 Chronic kidney disease, stage 3 unspecified: Secondary | ICD-10-CM | POA: Diagnosis not present

## 2023-04-29 DIAGNOSIS — R5383 Other fatigue: Secondary | ICD-10-CM | POA: Diagnosis not present

## 2023-04-29 DIAGNOSIS — R768 Other specified abnormal immunological findings in serum: Secondary | ICD-10-CM | POA: Diagnosis not present

## 2023-04-29 NOTE — Progress Notes (Signed)
Cardiology Clinic Note   Patient Name: Stephen Walker Date of Encounter: 05/04/2023  Primary Care Provider:  [facility] Primary Cardiologist:  Verne Carrow, MD  Patient Profile    Stephen Walker 72 year old male presents to the clinic today for follow-up evaluation of his hypertension and NSTEMI.  Past Medical History    Past Medical History:  Diagnosis Date   Angina pectoris (HCC)    Caregiver stress 03/19/2012   Chronic pain of right knee 05/11/2018   Hypertension    HYPERTENSION, BENIGN 09/14/2008   Qualifier: Diagnosis of  By: Excell Seltzer, MD, [place]    Insomnia 05/11/2018   Situational mixed anxiety and depressive disorder 03/19/2012   Secondary to Caregiver stress and impending death of mother    History reviewed. No pertinent surgical history.  Allergies  Allergies  Allergen Reactions   Lisinopril Swelling and Other (See Comments)    Lips swell   Crestor [Rosuvastatin] Other (See Comments)    Made the patient "not feel well"   Lactose Diarrhea   Metformin And Related Other (See Comments)    Made the patient "not feel well"   Pravastatin Other (See Comments)    Made the patient "not feel well"    History of Present Illness    Stephen Walker has a PMH of heroin use since the 70s, atypical chest pain, hypertension, insomnia, tobacco use, ACE inhibitor intolerance, and PE.  Echocardiogram 04/16/2023 showed an LVEF of 65-70%, intermediate diastolic parameters, McConnell sign, right ventricular systolic function moderately reduced, right ventricle severely enlarged, severe tricuspid valve regurgitation, mildly dilated left atria, severely dilated right atria.  He presented to the emergency department on 04/15/2023.  He reported crushing chest discomfort.  He had a 3-hour travel to his sister-in-law's house.  He reported that he was trying to quit heroin and had been sedentary at home.  He was found to have a submassive saddle PE on chest  CT.  His cardiac troponins were elevated at 1474 and decreased to 1367.  His white blood cell count was 15.  Cardiology was consulted.  Echocardiogram showed submassive PE with McConnell sign and RV dysfunction.  Pulmonology was also consulted.  There was not felt to be need for embolectomy or lysis.  He was also noted to have acute DVT in his right popliteal vein and acute DVT in his left popliteal vein as well as his left peroneal and left distal femoral veins.  He was placed on heparin drip and transition to Lovenox.  He was prescribed apixaban to be taken for 12 months.  He was noted to have runs of SVT which lasted for 13-20 beats on 04/17/2023.  These were felt to be precipitated by his PE.  He remained hemodynamically stable.  He was instructed to remain active but increases activity slowly.  At discharge he denied chest pain.  He presents to the clinic today for follow-up evaluation and states he feels great.  He has cut back on his smoking to about 30 pack/day.  We reviewed his medication.  He reports that he is out of his Suboxone.  He has been prescribed [facility] at [facility].  He reports that he has not used heroin in several weeks and is done with it.  I reviewed the importance of complete cessation.  We reviewed his echocardiogram.  He expressed understanding.  I will repeat his fasting lipids and LFTs.  Will plan follow-up in 3 to 4 months.  Today he denies chest  pain, shortness of breath, lower extremity edema, fatigue, palpitations, melena, hematuria, hemoptysis, diaphoresis, weakness, presyncope, syncope.   Home Medications    Prior to Admission medications   Medication Sig Start Date End Date Taking? Authorizing Provider  amLODipine (NORVASC) 2.5 MG tablet Take 1 tablet (2.5 mg total) by mouth daily as needed (For systolic blood pressure greater than 130 (top number)). Patient not taking: Reported on 04/15/2023 03/08/20   Nahser, Deloris Ping, MD  APIXABAN Everlene Balls) VTE STARTER PACK (10MG   AND 5MG ) Take as directed on package: start with two-5mg  tablets twice daily for 7 days. On day 8, switch to one-5mg  tablet twice daily. 04/17/23   Rhetta Mura, MD  buprenorphine-naloxone (SUBOXONE) 8-2 mg SUBL SL tablet Place 1 tablet under the tongue 2 (two) times daily. 04/17/23   Rhetta Mura, MD  diazepam (VALIUM) 5 MG tablet Take 1 tablet (5 mg total) by mouth every 6 (six) hours as needed for anxiety. 04/17/23   Rhetta Mura, MD  metoprolol succinate (TOPROL-XL) 100 MG 24 hr tablet Take 1.5 tablets (150 mg total) by mouth daily. Take with or immediately following a meal. Please make overdue appt with Dr. Shari Prows before anymore refills. Thank you 1st attempt 04/19/23   Rhetta Mura, MD  rosuvastatin (CRESTOR) 10 MG tablet Take 1 tablet (10 mg total) by mouth daily. Please make overdue appt with Dr. Shari Prows before anymore refills. Thank you 1st attempt Patient not taking: Reported on 04/15/2023 05/06/21   Meriam Sprague, MD  traZODone (DESYREL) 50 MG tablet TAKE 1 TABLET BY MOUTH AT  BEDTIME Patient taking differently: Take 50 mg by mouth at bedtime as needed for sleep. 06/21/19   Georgina Quint, MD    Family History    Family History  Problem Relation Age of Onset   Heart disease Mother        Atrial Fibrillation   He indicated that his mother is deceased. He indicated that his father is deceased.  Social History    Social History   Socioeconomic History   Marital status: Married    Spouse name: Not on file   Number of children: Not on file   Years of education: Not on file   Highest education level: Not on file  Occupational History   Occupation: retired  Tobacco Use   Smoking status: Every Day    Current packs/day: 0.50    Average packs/day: 0.5 packs/day for 50.0 years (25.0 ttl pk-yrs)    Types: Cigarettes   Smokeless tobacco: Never  Substance and Sexual Activity   Alcohol use: Never   Drug use: Not Currently    Types:  Heroin   Sexual activity: Not Currently    Partners: Female  Other Topics Concern   Not on file  Social History Narrative   ** Merged History Encounter **       Social Determinants of Health   Financial Resource Strain: Not on file  Food Insecurity: No Food Insecurity (04/15/2023)   Hunger Vital Sign    Worried About Running Out of Food in the Last Year: Never true    Ran Out of Food in the Last Year: Never true  Transportation Needs: No Transportation Needs (04/15/2023)   PRAPARE - Administrator, Civil Service (Medical): No    Lack of Transportation (Non-Medical): No  Physical Activity: Not on file  Stress: Not on file  Social Connections: Not on file  Intimate Partner Violence: Not At Risk (04/15/2023)   Humiliation, Afraid, Rape, and  Kick questionnaire    Fear of Current or Ex-Partner: No    Emotionally Abused: No    Physically Abused: No    Sexually Abused: No     Review of Systems    General:  No chills, fever, night sweats or weight changes.  Cardiovascular:  No chest pain, dyspnea on exertion, edema, orthopnea, palpitations, paroxysmal nocturnal dyspnea. Dermatological: No rash, lesions/masses Respiratory: No cough, dyspnea Urologic: No hematuria, dysuria Abdominal:   No nausea, vomiting, diarrhea, bright red blood per rectum, melena, or hematemesis Neurologic:  No visual changes, wkns, changes in mental status. All other systems reviewed and are otherwise negative except as noted above.  Physical Exam    VS:  BP 112/74 (BP Location: Left Arm, Patient Position: Sitting, Cuff Size: Normal)   Pulse 89   Ht 6' (1.829 m)   Wt 186 lb (84.4 kg)   SpO2 (!) 87%   BMI 25.23 kg/m  , BMI Body mass index is 25.23 kg/m. GEN: Well nourished, well developed, in no acute distress. HEENT: normal. Neck: Supple, no JVD, carotid bruits, or masses. Cardiac: RRR, no murmurs, rubs, or gallops. No clubbing, cyanosis, edema.  Radials/DP/PT 2+ and equal bilaterally.   Respiratory:  Respirations regular and unlabored, clear to auscultation bilaterally. GI: Soft, nontender, nondistended, BS + x 4. MS: no deformity or atrophy. Skin: warm and dry, no rash. Neuro:  Strength and sensation are intact. Psych: Normal affect.  Accessory Clinical Findings    Recent Labs: 04/17/2023: Magnesium 1.8 04/19/2023: ALT 24; BUN 11; Creatinine, Ser 1.27; Hemoglobin 14.6; Platelets 250; Potassium 3.9; Sodium 135   Recent Lipid Panel No results found for: "CHOL", "TRIG", "HDL", "CHOLHDL", "VLDL", "LDLCALC", "LDLDIRECT"       ECG personally reviewed by me today- none today.     Echocardiogram 04/16/2023   IMPRESSIONS     1. Left ventricular ejection fraction, by estimation, is 65 to 70%. The  left ventricle has normal function. The left ventricle has no regional  wall motion abnormalities. There is mild left ventricular hypertrophy.  Left ventricular diastolic parameters  are indeterminate. There is the interventricular septum is flattened in  diastole ('D' shaped left ventricle), consistent with right ventricular  volume overload.   2. McConnell's sign. Right ventricular systolic function is moderately  reduced. The right ventricular size is severely enlarged. There is mildly  elevated pulmonary artery systolic pressure. The estimated right  ventricular systolic pressure is 43.1 mmHg.   3. Tricuspid valve regurgitation is severe.   4. Left atrial size was mildly dilated.   5. Right atrial size was severely dilated.   6. The mitral valve is grossly normal. Trivial mitral valve  regurgitation. No evidence of mitral stenosis.   7. The aortic valve is tricuspid. Aortic valve regurgitation is not  visualized. No aortic stenosis is present.   8. The inferior vena cava is dilated in size with <50% respiratory  variability, suggesting right atrial pressure of 15 mmHg.   FINDINGS   Left Ventricle: Left ventricular ejection fraction, by estimation, is 65  to  70%. The left ventricle has normal function. The left ventricle has no  regional wall motion abnormalities. The left ventricular internal cavity  size was normal in size. There is   mild left ventricular hypertrophy. The interventricular septum is  flattened in diastole ('D' shaped left ventricle), consistent with right  ventricular volume overload. Left ventricular diastolic parameters are  indeterminate.   Right Ventricle: McConnell's sign. The right ventricular size is severely  enlarged. No increase in right ventricular wall thickness. Right  ventricular systolic function is moderately reduced. There is mildly  elevated pulmonary artery systolic pressure.  The tricuspid regurgitant velocity is 2.65 m/s, and with an assumed right  atrial pressure of 15 mmHg, the estimated right ventricular systolic  pressure is 43.1 mmHg.   Left Atrium: Left atrial size was mildly dilated.   Right Atrium: Right atrial size was severely dilated.   Pericardium: There is no evidence of pericardial effusion.   Mitral Valve: The mitral valve is grossly normal. Trivial mitral valve  regurgitation. No evidence of mitral valve stenosis.   Tricuspid Valve: The tricuspid valve is grossly normal. Tricuspid valve  regurgitation is severe. No evidence of tricuspid stenosis.   Aortic Valve: The aortic valve is tricuspid. Aortic valve regurgitation is  not visualized. No aortic stenosis is present.   Pulmonic Valve: The pulmonic valve was normal in structure. Pulmonic valve  regurgitation is trivial. No evidence of pulmonic stenosis.   Aorta: The aortic root and ascending aorta are structurally normal, with  no evidence of dilitation.   Venous: The inferior vena cava is dilated in size with less than 50%  respiratory variability, suggesting right atrial pressure of 15 mmHg. The  inferior vena cava and the hepatic vein show a pattern of systolic flow  reversal, suggestive of tricuspid  regurgitation.    IAS/Shunts: No atrial level shunt detected by color flow Doppler.      Assessment & Plan   1.  NSTEMI, PE-no chest pain today.  Was admitted with large saddle PE.  There was evidence of right heart strain in the setting of heroin withdrawal.  His cardiac troponins were elevated at 1474 and down trended to 1367.  His EKG showed diffuse nonspecific ST abnormalities.  He denied prior chest pain.  He did note 1 episode of sharp left-sided pain with no recurrence.  It was felt that his chest pain was secondary to PE rather than acute coronary syndrome.   No plans for ischemic evaluation were made Continue metoprolol, statin therapy, apixaban  Essential hypertension-BP today 112/74. Maintain blood pressure log Continue amlodipine, metoprolol Low-sodium diet  Hyperlipidemia-we will check fasting lipids and LFTs High-fiber diet Increase physical activity as tolerated  Heroin use, nicotine dependence-reviewed  strong recommendations for complete cessation. Reports compliance with Suboxone Follows with [name]  Disposition: Follow-up with Dr. Wyline Mood or me in 3-4 months.   Thomasene Ripple. Miriam Liles NP-C     05/04/2023, 2:51 PM [facility] [address] Office [phone] Fax [phone]    I spent 14 minutes examining this patient, reviewing medications, and using patient centered shared decision making involving her cardiac care.   I spent greater than 20 minutes reviewing her past medical history,  medications, and prior cardiac tests.

## 2023-05-01 DIAGNOSIS — Z79899 Other long term (current) drug therapy: Secondary | ICD-10-CM | POA: Diagnosis not present

## 2023-05-01 DIAGNOSIS — E119 Type 2 diabetes mellitus without complications: Secondary | ICD-10-CM | POA: Diagnosis not present

## 2023-05-04 ENCOUNTER — Ambulatory Visit: Payer: Medicare HMO | Attending: General Practice | Admitting: General Practice

## 2023-05-04 ENCOUNTER — Encounter: Payer: Self-pay | Admitting: General Practice

## 2023-05-04 VITALS — BP 112/74 | HR 89 | Ht 72.0 in | Wt 186.0 lb

## 2023-05-04 DIAGNOSIS — F17200 Nicotine dependence, unspecified, uncomplicated: Secondary | ICD-10-CM

## 2023-05-04 DIAGNOSIS — I214 Non-ST elevation (NSTEMI) myocardial infarction: Secondary | ICD-10-CM | POA: Diagnosis not present

## 2023-05-04 DIAGNOSIS — F1190 Opioid use, unspecified, uncomplicated: Secondary | ICD-10-CM

## 2023-05-04 DIAGNOSIS — I10 Essential (primary) hypertension: Secondary | ICD-10-CM

## 2023-05-04 DIAGNOSIS — E782 Mixed hyperlipidemia: Secondary | ICD-10-CM | POA: Diagnosis not present

## 2023-05-04 NOTE — Patient Instructions (Addendum)
Medication Instructions:  The current medical regimen is effective;  continue present plan and medications as directed. Please refer to the Current Medication list given to you today.  *If you need a refill on your cardiac medications before your next appointment, please call your pharmacy*  Lab Work: FASTING LIPID AND LFT IN 1-2 WEEKS If you have labs (blood work) drawn today and your tests are completely normal, you will receive your results only by:  MyChart Message (if you have MyChart) OR A paper copy in the mail If you have any lab test that is abnormal or we need to change your treatment, we will call you to review the results.  Other Instructions MAINTAIN PHYSICAL ACTIVITY PLEASE READ AND FOLLOW ATTACHED  SALTY 6   Follow-Up: At [facility], you and your health needs are our priority.  As part of our continuing mission to provide you with exceptional heart care, we have created designated Provider Care Teams.  These Care Teams include your primary Cardiologist (physician) and Advanced Practice Providers (APPs -  Physician Assistants and Nurse Practitioners) who all work together to provide you with the care you need, when you need it.  Your next appointment:   12 month(s)  Provider:   Verne Carrow, MD          Steps to Quit Smoking Smoking tobacco is the leading cause of preventable death. It can affect almost every organ in the body. Smoking puts you and people around you at risk for many serious, long-lasting (chronic) diseases. Quitting smoking can be hard, but it is one of the best things that you can do for your health. It is never too late to quit. Do not give up if you cannot quit the first time. Some people need to try many times to quit. Do your best to stick to your quit plan, and talk with your doctor if you have any questions or concerns. How do I get ready to quit? Pick a date to quit. Set a date within the next 2 weeks to give you time to  prepare. Write down the reasons why you are quitting. Keep this list in places where you will see it often. Tell your family, friends, and co-workers that you are quitting. Their support is important. Talk with your doctor about the choices that may help you quit. Find out if your health insurance will pay for these treatments. Know the people, places, things, and activities that make you want to smoke (triggers). Avoid them. What first steps can I take to quit smoking? Throw away all cigarettes at home, at work, and in your car. Throw away the things that you use when you smoke, such as ashtrays and lighters. Clean your car. Empty the ashtray. Clean your home, including curtains and carpets. What can I do to help me quit smoking? Talk with your doctor about taking medicines and seeing a counselor. You are more likely to succeed when you do both. If you are pregnant or breastfeeding: Talk with your doctor about counseling or other ways to quit smoking. Do not take medicine to help you quit smoking unless your doctor tells you to. Quit right away Quit smoking completely, instead of slowly cutting back on how much you smoke over a period of time. Stopping smoking right away may be more successful than slowly quitting. Go to counseling. In-person is best if this is an option. You are more likely to quit if you go to counseling sessions regularly. Take medicine  You may take medicines to help you quit. Some medicines need a prescription, and some you can buy over-the-counter. Some medicines may contain a drug called nicotine to replace the nicotine in cigarettes. Medicines may: Help you stop having the desire to smoke (cravings). Help to stop the problems that come when you stop smoking (withdrawal symptoms). Your doctor may ask you to use: Nicotine patches, gum, or lozenges. Nicotine inhalers or sprays. Non-nicotine medicine that you take by mouth. Find resources Find resources and other ways  to help you quit smoking and remain smoke-free after you quit. They include: Online chats with a Veterinary surgeon. Phone quitlines. Printed Materials engineer. Support groups or group counseling. Text messaging programs. Mobile phone apps. Use apps on your mobile phone or tablet that can help you stick to your quit plan. Examples of free services include Quit Guide from the CDC and smokefree.gov  What can I do to make it easier to quit?  Talk to your family and friends. Ask them to support and encourage you. Call a phone quitline, such as 1-800-QUIT-NOW, reach out to support groups, or work with a Veterinary surgeon. Ask people who smoke to not smoke around you. Avoid places that make you want to smoke, such as: Bars. Parties. Smoke-break areas at work. Spend time with people who do not smoke. Lower the stress in your life. Stress can make you want to smoke. Try these things to lower stress: Getting regular exercise. Doing deep-breathing exercises. Doing yoga. Meditating. What benefits will I see if I quit smoking? Over time, you may have: A better sense of smell and taste. Less coughing and sore throat. A slower heart rate. Lower blood pressure. Clearer skin. Better breathing. Fewer sick days. Summary Quitting smoking can be hard, but it is one of the best things that you can do for your health. Do not give up if you cannot quit the first time. Some people need to try many times to quit. When you decide to quit smoking, make a plan to help you succeed. Quit smoking right away, not slowly over a period of time. When you start quitting, get help and support to keep you smoke-free. This information is not intended to replace advice given to you by your health care provider. Make sure you discuss any questions you have with your health care provider. Document Revised: 05/17/2021 Document Reviewed: 05/17/2021 Elsevier Patient Education  2024 [employer].

## 2023-05-05 DIAGNOSIS — Z79899 Other long term (current) drug therapy: Secondary | ICD-10-CM | POA: Diagnosis not present

## 2023-05-18 ENCOUNTER — Other Ambulatory Visit: Payer: Self-pay | Admitting: Cardiovascular Disease

## 2023-05-18 MED ORDER — APIXABAN (ELIQUIS) VTE STARTER PACK (10MG AND 5MG): ORAL_TABLET | ORAL | 0 refills | Status: DC

## 2023-05-18 NOTE — Telephone Encounter (Signed)
*  STAT* If patient is at the pharmacy, call can be transferred to refill team.   1. Which medications need to be refilled? (please list name of each medication and dose if known) APIXABAN (ELIQUIS) VTE STARTER PACK (10MG  AND 5MG )   2. Which pharmacy/location (including street and city if local pharmacy) is medication to be sent to? [id number] - [place], [place] - [address] AT [facility] [address]   3. Do they need a 30 day or 90 day supply? 90

## 2023-05-18 NOTE — Telephone Encounter (Signed)
Pt is requesting an Eliquis refill

## 2023-05-19 ENCOUNTER — Telehealth: Payer: Self-pay | Admitting: Cardiovascular Disease

## 2023-05-19 MED ORDER — APIXABAN 5 MG PO TABS: 5.0000 mg | ORAL_TABLET | Freq: Two times a day (BID) | ORAL | 1 refills | Status: DC

## 2023-05-19 NOTE — Telephone Encounter (Signed)
He now needs to take 5mg  BID. Will send in rx

## 2023-05-19 NOTE — Telephone Encounter (Signed)
Pt c/o medication issue:  1. Name of Medication:   APIXABAN (ELIQUIS) VTE STARTER PACK (10MG  AND 5MG )    2. How are you currently taking this medication (dosage and times per day)? Take as directed on package: start with two-5mg  tablets twice daily for 7 days. On day 8, switch to one-5mg  tablet twice daily.   3. Are you having a reaction (difficulty breathing--STAT)? No  4. What is your medication issue? Pt would like to know how he can get a refill for this medication. Please advise

## 2023-05-23 DIAGNOSIS — F411 Generalized anxiety disorder: Secondary | ICD-10-CM | POA: Diagnosis not present

## 2023-05-23 DIAGNOSIS — Z1339 Encounter for screening examination for other mental health and behavioral disorders: Secondary | ICD-10-CM | POA: Diagnosis not present

## 2023-05-23 DIAGNOSIS — F331 Major depressive disorder, recurrent, moderate: Secondary | ICD-10-CM | POA: Diagnosis not present

## 2023-05-23 DIAGNOSIS — Z79899 Other long term (current) drug therapy: Secondary | ICD-10-CM | POA: Diagnosis not present

## 2023-05-23 DIAGNOSIS — G4700 Insomnia, unspecified: Secondary | ICD-10-CM | POA: Diagnosis not present

## 2023-05-23 DIAGNOSIS — Z1331 Encounter for screening for depression: Secondary | ICD-10-CM | POA: Diagnosis not present

## 2023-05-27 DIAGNOSIS — R768 Other specified abnormal immunological findings in serum: Secondary | ICD-10-CM | POA: Diagnosis not present

## 2023-05-29 DIAGNOSIS — E7800 Pure hypercholesterolemia, unspecified: Secondary | ICD-10-CM | POA: Diagnosis not present

## 2023-05-29 DIAGNOSIS — D539 Nutritional anemia, unspecified: Secondary | ICD-10-CM | POA: Diagnosis not present

## 2023-05-29 DIAGNOSIS — F1123 Opioid dependence with withdrawal: Secondary | ICD-10-CM | POA: Diagnosis not present

## 2023-05-29 DIAGNOSIS — I2699 Other pulmonary embolism without acute cor pulmonale: Secondary | ICD-10-CM | POA: Diagnosis not present

## 2023-05-29 DIAGNOSIS — E119 Type 2 diabetes mellitus without complications: Secondary | ICD-10-CM | POA: Diagnosis not present

## 2023-05-29 DIAGNOSIS — R768 Other specified abnormal immunological findings in serum: Secondary | ICD-10-CM | POA: Diagnosis not present

## 2023-05-29 DIAGNOSIS — Z79899 Other long term (current) drug therapy: Secondary | ICD-10-CM | POA: Diagnosis not present

## 2023-05-29 DIAGNOSIS — R5383 Other fatigue: Secondary | ICD-10-CM | POA: Diagnosis not present

## 2023-05-29 DIAGNOSIS — I10 Essential (primary) hypertension: Secondary | ICD-10-CM | POA: Diagnosis not present

## 2023-11-22 ENCOUNTER — Other Ambulatory Visit: Payer: Self-pay | Admitting: Cardiovascular Disease

## 2023-11-23 NOTE — Telephone Encounter (Signed)
 Prescription refill request for Eliquis  received. Indication: Last office visit:11/24 Scr:1.27  11/24 Age: 73 Weight:84.4  kg  Prescription refilled

## 2024-06-30 ENCOUNTER — Other Ambulatory Visit: Payer: Self-pay | Admitting: Cardiovascular Disease

## 2024-06-30 NOTE — Telephone Encounter (Signed)
 Prescription refill request for Eliquis  received.  Last office visit: mcalhany, 05/04/2023 Scr:  1.27, 04/19/2023 Age:74 yo  Weight: 84.4 kg   Pt is overdue to see cardiologist. Msg sent to schedulers.     Per note from Josefa Beauvais 05/04/2023:   He was placed on heparin  drip and transition to Lovenox . He was prescribed apixaban  to be taken for 12 months.

## 2024-07-05 NOTE — Progress Notes (Unsigned)
 " Cardiology Office Note   Date:  07/06/2024  ID:  Stephen Walker, Stephen Walker 04-22-1951, MRN [id number] PCP: [facility] Providers Cardiologist:  Lonni Cash, MD   History of Present Illness Stephen Walker is a 74 y.o. male with a past medical history of hypertension and NSTEMI here for follow-up appointment.  Patient also has a history of heroin use since the 70s, atypical chest pain, hypertension, insomnia, tobacco use, ACE inhibitor intolerance, and PE.  Echo 04/16/2023 showed LVEF 65 to 70%, intermediate diastolic parameters, McConnell sign, right ventricular systolic function moderately reduced, right ventricular severely enlarged, severe tricuspid valve regurgitation, mildly dilated left atria, severely dilated right atria.  He presented to the emergency room April 15, 2023 with crushing chest discomfort.  Had a 3-hour travel to his sister-in-law's house.  He reported at that time he was trying to quit heroin and had been sedentary at home.  Was found to have a submassive saddle PE on chest CT.  Troponin was elevated at 1474 and decreased to 1367.  WBCs was 15.  Cardiology consulted.  Echo showed submassive PE with McConnell sign and RV dysfunction.  Pulmonology was also consulted.  Not felt to be in need of embolectomy or lysis.  Noted to have acute DVT in his right popliteal vein and acute DVT in left popliteal vein as well as left peroneal and left distal femoral veins.  He was placed on heparin  drip and then transition to Lovenox .  He was on apixaban  to be taken for 12 months.  Noted to have runs of SVT which lasted 13-20 beats.  These were thought to be precipitated by his PE.  Remained hemodynamically stable.  He was discharged and followed up and felt great at that time.  Cut back on smoking and was on Suboxone .  He had not used heroin in several weeks and was done with it.  Reviewed importance of complete cessation at that time.  Reviewed echo as  well with him.  Plan to repeat lab work.  At that time no cardiovascular symptoms.  Today, he has a hx of pulmonary embolism and deep vein thrombosis who presents with changes in heart rhythm and shortness of breath.  He notes his heart rhythm changed over the past months, with a faster rate in November 2024 and now slower on metoprolol  succinate 100 mg daily. He has brief SVT episodes with rapid beats lasting 3 to 20 beats, associated with lightheadedness, shortness of breath, and near syncope, without complete loss of consciousness.  He was off Eliquis  for about three weeks but has restarted it and currently has only a 15-day supply while awaiting refill. He has prior pulmonary embolism and DVT.  Over the last 2 to 3 weeks he has developed new swelling of the left ankle without injury. X-ray showed no fracture.  He smokes cigarettes and uses marijuana. He has exertional shortness of breath with activities like climbing one flight of stairs, though not as severe as during his prior pulmonary embolism. He denies use of amlodipine  or Suboxone .  Reports no chest pain, pressure, or tightness. No orthopnea, PND.   ROS: pertinent ROS in HPI  Studies Reviewed EKG Interpretation Date/Time:  Wednesday July 06 2024 11:32:13 EST Ventricular Rate:  42 PR Interval:  194 QRS Duration:  84 QT Interval:  468 QTC Calculation: 390 R Axis:   1  Text Interpretation: Marked sinus bradycardia When compared with ECG of 17-Apr-2023 13:50, Vent. rate has decreased BY  81 BPM T wave inversion no longer evident in Anterolateral leads Confirmed by Lucien Blanc [phone]) on 07/06/2024 11:58:36 AM    Echocardiogram 04/16/2023     IMPRESSIONS     1. Left ventricular ejection fraction, by estimation, is 65 to 70%. The  left ventricle has normal function. The left ventricle has no regional  wall motion abnormalities. There is mild left ventricular hypertrophy.  Left ventricular diastolic parameters  are  indeterminate. There is the interventricular septum is flattened in  diastole ('D' shaped left ventricle), consistent with right ventricular  volume overload.   2. McConnell's sign. Right ventricular systolic function is moderately  reduced. The right ventricular size is severely enlarged. There is mildly  elevated pulmonary artery systolic pressure. The estimated right  ventricular systolic pressure is 43.1 mmHg.   3. Tricuspid valve regurgitation is severe.   4. Left atrial size was mildly dilated.   5. Right atrial size was severely dilated.   6. The mitral valve is grossly normal. Trivial mitral valve  regurgitation. No evidence of mitral stenosis.   7. The aortic valve is tricuspid. Aortic valve regurgitation is not  visualized. No aortic stenosis is present.   8. The inferior vena cava is dilated in size with <50% respiratory  variability, suggesting right atrial pressure of 15 mmHg.   FINDINGS   Left Ventricle: Left ventricular ejection fraction, by estimation, is 65  to 70%. The left ventricle has normal function. The left ventricle has no  regional wall motion abnormalities. The left ventricular internal cavity  size was normal in size. There is   mild left ventricular hypertrophy. The interventricular septum is  flattened in diastole ('D' shaped left ventricle), consistent with right  ventricular volume overload. Left ventricular diastolic parameters are  indeterminate.   Right Ventricle: McConnell's sign. The right ventricular size is severely  enlarged. No increase in right ventricular wall thickness. Right  ventricular systolic function is moderately reduced. There is mildly  elevated pulmonary artery systolic pressure.  The tricuspid regurgitant velocity is 2.65 m/s, and with an assumed right  atrial pressure of 15 mmHg, the estimated right ventricular systolic  pressure is 43.1 mmHg.   Left Atrium: Left atrial size was mildly dilated.   Right Atrium: Right atrial  size was severely dilated.   Pericardium: There is no evidence of pericardial effusion.   Mitral Valve: The mitral valve is grossly normal. Trivial mitral valve  regurgitation. No evidence of mitral valve stenosis.   Tricuspid Valve: The tricuspid valve is grossly normal. Tricuspid valve  regurgitation is severe. No evidence of tricuspid stenosis.   Aortic Valve: The aortic valve is tricuspid. Aortic valve regurgitation is  not visualized. No aortic stenosis is present.   Pulmonic Valve: The pulmonic valve was normal in structure. Pulmonic valve  regurgitation is trivial. No evidence of pulmonic stenosis.   Aorta: The aortic root and ascending aorta are structurally normal, with  no evidence of dilitation.   Venous: The inferior vena cava is dilated in size with less than 50%  respiratory variability, suggesting right atrial pressure of 15 mmHg. The  inferior vena cava and the hepatic vein show a pattern of systolic flow  reversal, suggestive of tricuspid  regurgitation.   IAS/Shunts: No atrial level shunt detected by color flow Doppler.       Physical Exam VS:  BP 100/64   Pulse (!) 41   Ht 6' (1.829 m)   Wt 167 lb (75.8 kg)   SpO2 96%  BMI 22.65 kg/m        Wt Readings from Last 3 Encounters:  07/06/24 167 lb (75.8 kg)  05/04/23 186 lb (84.4 kg)  04/15/23 172 lb 6.4 oz (78.2 kg)    GEN: Well nourished, well developed in no acute distress NECK: No JVD; No carotid bruits CARDIAC: sinus brady, no murmurs, rubs, gallops RESPIRATORY:  Clear to auscultation without rales, wheezing or rhonchi  ABDOMEN: Soft, non-tender, non-distended EXTREMITIES:  No edema; No deformity   ASSESSMENT AND PLAN  Bradycardia and paroxysmal supraventricular tachycardia Bradycardia with heart rate of 41 bpm and paroxysmal SVT.  - Reduced metoprolol  succinate to 50 mg daily. - Ordered 14-day cardiac monitor to assess heart rhythm and potential heart block. - Scheduled echocardiogram to  evaluate heart function and anatomy. - Mailed cardiac monitor for home use, denied in clinic placement  History of pulmonary embolism and deep vein thrombosis, on anticoagulation Resumed Eliquis  after temporary discontinuation. Increased clot risk with cannabis and tobacco use. - Refilled Eliquis  prescription. - Ordered ultrasound of both legs to rule out deep vein thrombosis. -ordered updated CBC and BMP  Evaluation of left ankle swelling and shortness of breath New left ankle swelling with shortness of breath. Differential includes deep vein thrombosis. - Ordered ultrasound of both legs to evaluate for deep vein thrombosis.  Essential hypertension Blood pressure at 112/74 mmHg. No amlodipine  due to low blood pressure. - Continue current blood pressure management without amlodipine .  Nicotine dependence, cigarettes Continued smoking increases risk of blood clots. - Discussed risks of tobacco use and its contribution to hypercoagulability.      Dispo: He can return in 8 weeks to see me and review testing.   Signed, Orren LOISE Fabry, PA-C   "

## 2024-07-06 ENCOUNTER — Ambulatory Visit: Attending: Cardiology | Admitting: Physician Assistant

## 2024-07-06 ENCOUNTER — Other Ambulatory Visit: Payer: Self-pay | Admitting: Physician Assistant

## 2024-07-06 ENCOUNTER — Encounter: Payer: Self-pay | Admitting: Physician Assistant

## 2024-07-06 ENCOUNTER — Ambulatory Visit

## 2024-07-06 VITALS — BP 100/64 | HR 41 | Ht 72.0 in | Wt 167.0 lb

## 2024-07-06 DIAGNOSIS — F17200 Nicotine dependence, unspecified, uncomplicated: Secondary | ICD-10-CM

## 2024-07-06 DIAGNOSIS — I214 Non-ST elevation (NSTEMI) myocardial infarction: Secondary | ICD-10-CM

## 2024-07-06 DIAGNOSIS — R001 Bradycardia, unspecified: Secondary | ICD-10-CM

## 2024-07-06 DIAGNOSIS — E782 Mixed hyperlipidemia: Secondary | ICD-10-CM | POA: Diagnosis not present

## 2024-07-06 DIAGNOSIS — F1190 Opioid use, unspecified, uncomplicated: Secondary | ICD-10-CM

## 2024-07-06 DIAGNOSIS — I10 Essential (primary) hypertension: Secondary | ICD-10-CM

## 2024-07-06 MED ORDER — APIXABAN 5 MG PO TABS: 5.0000 mg | ORAL_TABLET | Freq: Two times a day (BID) | ORAL | 1 refills | Status: AC

## 2024-07-06 MED ORDER — METOPROLOL SUCCINATE ER 50 MG PO TB24: 50.0000 mg | ORAL_TABLET | Freq: Every day | ORAL | Status: AC

## 2024-07-06 NOTE — Progress Notes (Unsigned)
Enrolled for [employer] to mail a ZIO AT Live Telemetry monitor to patients address on file.   Dr. Angelena Form to read.

## 2024-07-06 NOTE — Patient Instructions (Addendum)
 Medication Instructions:  CONTINUE  to HOLD Norvasc  (Amlodipine ) DECREASE Metoprolol  Succinate (Toprol  XL) to 50mg  Take 1 tablet daily (you can cut the 100mg  tablet in half and take half tablet once a day; or you can pick up the 50mg  tablets from the pharmacy) *If you need a refill on your cardiac medications before your next appointment, please call your pharmacy*  Lab Work: TODAY-BMET & CBC If you have labs (blood work) drawn today and your tests are completely normal, you will receive your results only by: MyChart Message (if you have MyChart) OR A paper copy in the mail If you have any lab test that is abnormal or we need to change your treatment, we will call you to review the results.  Testing/Procedures: Your physician has requested that you have an STAT echocardiogram. Echocardiography is a painless test that uses sound waves to create images of your heart. It provides your doctor with information about the size and shape of your heart and how well your hearts chambers and valves are working. This procedure takes approximately one hour. There are no restrictions for this procedure. Please do NOT wear cologne, perfume, aftershave, or lotions (deodorant is allowed). Please arrive 15 minutes prior to your appointment time.  Please note: We ask at that you not bring children with you during ultrasound (echo/ vascular) testing. Due to room size and safety concerns, children are not allowed in the ultrasound rooms during exams. Our front office staff cannot provide observation of children in our lobby area while testing is being conducted. An adult accompanying a patient to their appointment will only be allowed in the ultrasound room at the discretion of the ultrasound technician under special circumstances. We apologize for any inconvenience.  Your physician has requested that you have a lower extremity venous duplex. This test is an ultrasound of the veins in the legs or arms. It looks at  venous blood flow that carries blood from the heart to the legs. Allow one hour for a Lower Venous exam. There are no restrictions or special instructions.  Please note: We ask at that you not bring children with you during ultrasound (echo/ vascular) testing. Due to room size and safety concerns, children are not allowed in the ultrasound rooms during exams. Our front office staff cannot provide observation of children in our lobby area while testing is being conducted. An adult accompanying a patient to their appointment will only be allowed in the ultrasound room at the discretion of the ultrasound technician under special circumstances. We apologize for any inconvenience.  ZIO AT Long term monitor-Live Telemetry  Your physician has requested you wear a ZIO patch monitor for 14 days.  This is a single patch monitor. [employer] supplies one patch monitor per enrollment. Additional  stickers are not available.  Please do not apply patch if you will be having a Nuclear Stress Test, Echocardiogram, Cardiac CT, MRI,  or Chest Xray during the period you would be wearing the monitor. The patch cannot be worn during  these tests. You cannot remove and re-apply the ZIO AT patch monitor.  Your ZIO patch monitor will be mailed 3 day USPS to your address on file. It may take 3-5 days to  receive your monitor after you have been enrolled.  Once you have received your monitor, please review the enclosed instructions. Your monitor has  already been registered assigning a specific monitor serial # to you.   Billing and Patient Assistance Program information  [facility] has been supplied  with any insurance information on record for billing. [employer] offers a sliding scale Patient Assistance Program for patients without insurance, or whose  insurance does not completely cover the cost of the ZIO patch monitor. You must apply for the  Patient Assistance Program to qualify for the discounted rate. To apply, call  [employer] at [phone],  select option 4, select option 2 , ask to apply for the Patient Assistance Program, (you can request an  interpreter if needed). [employer] will ask your household income and how many people are in your  household. [employer] will quote your out-of-pocket cost based on this information. They will also be able  to set up a 12 month interest free payment plan if needed.  Applying the monitor   Shave hair from upper left chest.  Hold the abrader disc by orange tab. Rub the abrader in 40 strokes over left upper chest as indicated in  your monitor instructions.  Clean area with 4 enclosed alcohol  pads. Use all pads to ensure the area is cleaned thoroughly. Let  dry.  Apply patch as indicated in monitor instructions. Patch will be placed under collarbone on left side of  chest with arrow pointing upward.  Rub patch adhesive wings for 2 minutes. Remove the white label marked 1. Remove the white label  marked 2. Rub patch adhesive wings for 2 additional minutes.  While looking in a mirror, press and release button in center of patch. A small green light will flash 3-4  times. This will be your only indicator that the monitor has been turned on.  Do not shower for the first 24 hours. You may shower after the first 24 hours.  Press the button if you feel a symptom. You will hear a small click. Record Date, Time and Symptom in  the Patient Log.   Starting the Gateway  In your kit there is a audiological scientist box the size of a cellphone. This is [employer]. It transmits all your  recorded data to [facility]. This box must always stay within 10 feet of you. Open the box and push the *  button. There will be a light that blinks orange and then green a few times. When the light stops  blinking, the Gateway is connected to the ZIO patch. Call [employer] at [phone] to confirm your monitor is transmitting.  Returning your monitor  Remove your patch and place it inside the Gateway.  In the lower half of the Gateway there is a white  bag with prepaid postage on it. Place Gateway in bag and seal. Mail package back to [facility] as soon as  possible. Your physician should have your final report approximately 7 days after you have mailed back  your monitor. Call [facility] Customer Care at [phone] if you have questions regarding your ZIO AT  patch monitor. Call them immediately if you see an orange light blinking on your monitor.  If your monitor falls off in less than 4 days, contact our Monitor department at [phone]. If your  monitor becomes loose or falls off after 4 days call [employer] at [phone] for suggestions on  securing your monitor  Follow-Up: At [facility], you and your health needs are our priority.  As part of our continuing mission to provide you with exceptional heart care, our providers are all part of one team.  This team includes your primary Cardiologist (physician) and Advanced Practice Providers or APPs (Physician Assistants and Nurse Practitioners) who all work together  to provide you with the care you need, when you need it.  Your next appointment:   6-8 week(s)  Provider:   Orren Fabry, PA-C or Any APP  We recommend signing up for the patient portal called MyChart.  Sign up information is provided on this After Visit Summary.  MyChart is used to connect with patients for Virtual Visits (Telemedicine).  Patients are able to view lab/test results, encounter notes, upcoming appointments, etc.  Non-urgent messages can be sent to your provider as well.   To learn more about what you can do with MyChart, go to [email or web address].   Other Instructions  Please check your blood pressure and heart rate daily for two weeks, then contact the office with your readings  Please contact the office with your readings either by phone, by dropping it off in person, or by sending it through MyChart.   Be sure to check your  blood pressure one to two hours after taking your medications.  Avoid the following for 30 minutes before checking your blood pressure: No caffeine No alcohol  No eating No smoking  No exercise  Five minutes before checking your blood pressure: Use the restroom Sit up straight in a chair with your back supported and feet flat on the floor Remain quiet and do not talk

## 2024-07-13 ENCOUNTER — Ambulatory Visit (HOSPITAL_COMMUNITY)
Admission: RE | Admit: 2024-07-13 | Discharge: 2024-07-13 | Disposition: A | Source: Ambulatory Visit | Attending: Physician Assistant | Admitting: Physician Assistant

## 2024-07-13 DIAGNOSIS — I214 Non-ST elevation (NSTEMI) myocardial infarction: Secondary | ICD-10-CM

## 2024-07-13 DIAGNOSIS — I10 Essential (primary) hypertension: Secondary | ICD-10-CM

## 2024-07-13 DIAGNOSIS — R079 Chest pain, unspecified: Secondary | ICD-10-CM

## 2024-07-13 DIAGNOSIS — F1190 Opioid use, unspecified, uncomplicated: Secondary | ICD-10-CM

## 2024-07-13 DIAGNOSIS — F17200 Nicotine dependence, unspecified, uncomplicated: Secondary | ICD-10-CM

## 2024-07-13 DIAGNOSIS — R001 Bradycardia, unspecified: Secondary | ICD-10-CM

## 2024-07-13 DIAGNOSIS — R0600 Dyspnea, unspecified: Secondary | ICD-10-CM

## 2024-07-13 DIAGNOSIS — E782 Mixed hyperlipidemia: Secondary | ICD-10-CM

## 2024-07-13 LAB — ECHOCARDIOGRAM COMPLETE
Area-P 1/2: 2.87 cm2
S' Lateral: 2.7 cm

## 2024-07-21 ENCOUNTER — Ambulatory Visit (HOSPITAL_COMMUNITY)

## 2024-08-23 ENCOUNTER — Ambulatory Visit: Admitting: Emergency Medicine
